# Patient Record
Sex: Female | Born: 1950 | Race: White | Hispanic: No | Marital: Married | Smoking: Never smoker
Health system: Southern US, Community
[De-identification: ages and names within clinical notes are randomized; demographics above are authoritative.]

## PROBLEM LIST (undated history)

## (undated) DIAGNOSIS — E119 Type 2 diabetes mellitus without complications: Secondary | ICD-10-CM

## (undated) DIAGNOSIS — J309 Allergic rhinitis, unspecified: Secondary | ICD-10-CM

## (undated) DIAGNOSIS — E7800 Pure hypercholesterolemia, unspecified: Secondary | ICD-10-CM

## (undated) DIAGNOSIS — I10 Essential (primary) hypertension: Secondary | ICD-10-CM

## (undated) DIAGNOSIS — M1990 Unspecified osteoarthritis, unspecified site: Secondary | ICD-10-CM

## (undated) DIAGNOSIS — F419 Anxiety disorder, unspecified: Secondary | ICD-10-CM

## (undated) HISTORY — PX: COLONOSCOPY: SHX174

## (undated) HISTORY — PX: HYSTEROSCOPY: SHX211

## (undated) HISTORY — PX: OTHER SURGICAL HISTORY: SHX169

---

## 2006-07-31 ENCOUNTER — Ambulatory Visit (HOSPITAL_COMMUNITY): Admission: RE | Admit: 2006-07-31 | Discharge: 2006-07-31 | Payer: Self-pay | Admitting: Orthopaedic Surgery

## 2013-08-30 ENCOUNTER — Encounter (HOSPITAL_COMMUNITY): Payer: Self-pay | Admitting: Pharmacy Technician

## 2013-08-30 ENCOUNTER — Other Ambulatory Visit: Payer: Self-pay | Admitting: Radiology

## 2013-09-01 ENCOUNTER — Encounter (HOSPITAL_COMMUNITY)
Admission: RE | Admit: 2013-09-01 | Discharge: 2013-09-01 | Disposition: A | Discharge status: Home / Self Care | Payer: BC Managed Care – PPO | Source: Ambulatory Visit | Attending: Orthopaedic Surgery | Admitting: Orthopaedic Surgery

## 2013-09-01 ENCOUNTER — Encounter (HOSPITAL_COMMUNITY): Payer: Self-pay

## 2013-09-01 DIAGNOSIS — Z01812 Encounter for preprocedural laboratory examination: Secondary | ICD-10-CM | POA: Insufficient documentation

## 2013-09-01 HISTORY — DX: Type 2 diabetes mellitus without complications: E11.9

## 2013-09-01 HISTORY — DX: Essential (primary) hypertension: I10

## 2013-09-01 HISTORY — DX: Unspecified osteoarthritis, unspecified site: M19.90

## 2013-09-01 HISTORY — DX: Pure hypercholesterolemia, unspecified: E78.00

## 2013-09-01 LAB — CBC WITH DIFFERENTIAL/PLATELET
Basophils Absolute: 0 10*3/uL (ref 0.0–0.1)
Basophils Relative: 0 % (ref 0–1)
Eosinophils Absolute: 0.3 10*3/uL (ref 0.0–0.7)
Eosinophils Relative: 4 % (ref 0–5)
HEMATOCRIT: 42.2 % (ref 36.0–46.0)
HEMOGLOBIN: 13.2 g/dL (ref 12.0–15.0)
LYMPHS PCT: 29 % (ref 12–46)
Lymphs Abs: 1.9 10*3/uL (ref 0.7–4.0)
MCH: 27.6 pg (ref 26.0–34.0)
MCHC: 31.3 g/dL (ref 30.0–36.0)
MCV: 88.1 fL (ref 78.0–100.0)
Monocytes Absolute: 0.7 10*3/uL (ref 0.1–1.0)
Monocytes Relative: 10 % (ref 3–12)
NEUTROS PCT: 56 % (ref 43–77)
Neutro Abs: 3.7 10*3/uL (ref 1.7–7.7)
Platelets: 334 10*3/uL (ref 150–400)
RBC: 4.79 MIL/uL (ref 3.87–5.11)
RDW: 13.7 % (ref 11.5–15.5)
WBC: 6.6 10*3/uL (ref 4.0–10.5)

## 2013-09-01 LAB — URINALYSIS, ROUTINE W REFLEX MICROSCOPIC
BILIRUBIN URINE: NEGATIVE
Glucose, UA: NEGATIVE mg/dL
HGB URINE DIPSTICK: NEGATIVE
Ketones, ur: NEGATIVE mg/dL
LEUKOCYTES UA: NEGATIVE
Nitrite: NEGATIVE
PH: 7 (ref 5.0–8.0)
Protein, ur: NEGATIVE mg/dL
Specific Gravity, Urine: 1.02 (ref 1.005–1.030)
UROBILINOGEN UA: 0.2 mg/dL (ref 0.0–1.0)

## 2013-09-01 LAB — COMPREHENSIVE METABOLIC PANEL
ALBUMIN: 3.7 g/dL (ref 3.5–5.2)
ALT: 19 U/L (ref 0–35)
AST: 21 U/L (ref 0–37)
Alkaline Phosphatase: 143 U/L — ABNORMAL HIGH (ref 39–117)
BILIRUBIN TOTAL: 0.4 mg/dL (ref 0.3–1.2)
BUN: 15 mg/dL (ref 6–23)
CALCIUM: 10.7 mg/dL — AB (ref 8.4–10.5)
CHLORIDE: 104 meq/L (ref 96–112)
CO2: 31 mEq/L (ref 19–32)
CREATININE: 1.11 mg/dL — AB (ref 0.50–1.10)
GFR calc Af Amer: 61 mL/min — ABNORMAL LOW (ref >90)
GFR, EST NON AFRICAN AMERICAN: 52 mL/min — AB (ref >90)
Glucose, Bld: 105 mg/dL — ABNORMAL HIGH (ref 70–99)
Potassium: 5.2 mEq/L (ref 3.7–5.3)
Sodium: 146 mEq/L (ref 137–147)
TOTAL PROTEIN: 7.1 g/dL (ref 6.0–8.3)

## 2013-09-01 NOTE — Patient Instructions (Signed)
Janice [name]  09/01/2013   Your procedure isTeofilo Pod scheduled on:  Tuesday, 09/07/13  [facility] [place].  Call this number if you have problems the morning of surgery: [phone]   Remember:   Do not eat food or drink liquids after midnight.   Take these medicines the morning of surgery with A SIP OF WATER: lisinopril, paxil, and tramadol if needed.   Do not wear jewelry, make-up or nail polish.  Do not wear lotions, powders, or perfumes. You may wear deodorant.  Do not shave 48 hours prior to surgery. Men may shave face and neck.  Do not bring valuables to the hospital.  [facility] is not responsible                  for any belongings or valuables.               Contacts, dentures or bridgework may not be worn into surgery.  Leave suitcase in the car. After surgery it may be brought to your room.  For patients admitted to the hospital, discharge time is determined by your                treatment team.               Patients discharged the day of surgery will not be allowed to drive  home.  Name and phone number of your driver: family  Special Instructions: Incentive Spirometry - Practice and bring it with you on the day of surgery. Shower using CHG 2 nights before surgery and the night before surgery.  If you shower the day of surgery use CHG.  Use special wash - you have one bottle of CHG for all showers.  You should use approximately 1/3 of the bottle for each shower.   Please read over the following fact sheets that you were given: Pain Booklet, Surgical Site Infection Prevention, Anesthesia Post-op Instructions and Care and Recovery After Surgery   Carpal Tunnel Release (Repair), Care After Refer to this sheet in the next few weeks. These discharge instructions provide you with general information on caring for yourself after you leave the hospital. Your caregiver may also give you specific instructions. Your treatment has been planned according to the most current  medical practices available, but unavoidable complications sometimes occur. If you have any problems or questions after discharge, please call your caregiver. HOME CARE INSTRUCTIONS   Have a responsible person with you for 24 hours.  Do not drive a car or take public transportation for 24 hours.  Only take over-the-counter or prescription medicines for pain, discomfort, or fever as directed by your caregiver. Take them as directed.  You may put ice on the palm side of the affected wrist.  Put ice in a plastic bag.  Place a towel between your skin and the bag.  Leave the ice on for 15-20 minutes, 03-04 times per day.  If you were given a splint to keep your wrist from bending, use it as directed. It is important to wear the splint at night or as directed. Use the splint for as long as you have pain or numbness in your hand, arm, or wrist. This may take 1 to 2 months.  Keep your hand raised (elevated) above the level of your heart as much as possible. This keeps swelling down and helps with discomfort.  Change bandages (dressings) as directed.  Keep the wound clean and dry. SEEK MEDICAL CARE  IF:   You develop pain not relieved with medicines.  You develop numbness of your hand.  You develop bleeding from your surgical site.  You have an oral temperature above 102 F (38.9 C).  You develop redness or swelling of the surgical site.  You develop new, unexplained problems. SEEK IMMEDIATE MEDICAL CARE IF:   You develop a rash.  You have difficulty breathing.  You develop any reaction or side effects to medicines given. MAKE SURE YOU:   Understand these instructions.  Will watch your condition.  Will get help right away if you are not doing well or get worse. Document Released: 01/25/2005 Document Revised: 04/28/2013 Document Reviewed: 05/14/2007 [facility] Patient Information 2014 [place], [place].  PATIENT INSTRUCTIONS POST-ANESTHESIA  IMMEDIATELY FOLLOWING SURGERY:   Do not drive or operate machinery for the first twenty four hours after surgery.  Do not make any important decisions for twenty four hours after surgery or while taking narcotic pain medications or sedatives.  If you develop intractable nausea and vomiting or a severe headache please notify your doctor immediately.  FOLLOW-UP:  Please make an appointment with your surgeon as instructed. You do not need to follow up with anesthesia unless specifically instructed to do so.  WOUND CARE INSTRUCTIONS (if applicable):  Keep a dry clean dressing on the anesthesia/puncture wound site if there is drainage.  Once the wound has quit draining you may leave it open to air.  Generally you should leave the bandage intact for twenty four hours unless there is drainage.  If the epidural site drains for more than 36-48 hours please call the anesthesia department.  QUESTIONS?:  Please feel free to call your physician or the hospital operator if you have any questions, and they will be happy to assist you.
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
## 2013-09-20 NOTE — H&P (Signed)
Janice [name] is an 63 y.o. female.   Chief Complaint: Carpal Tunnel Syndrome Right HPI: She has had pain in both hand, more on the right, in the median nerve distribution with decreased sensation.  She has tried rest, conservative treatment including NSAIDs. She had positive EMGs showing carpal tunnel syndrome.  She has not improved with conservative treatment.  Options of surgery has been discussed.  I have gone over risks and imponderables of surgical release as an elective outpatient procedure.  She asked appropriate questions.  She agrees to the procedure.  Past Medical History  Diagnosis Date  . Diabetes mellitus without complication   . Hypertension   . Arthritis   . Hypercholesteremia     Past Surgical History  Procedure Laterality Date  . Cesarean section      x2    No family history on file. Social History:  reports that she has never smoked. She does not have any smokeless tobacco history on file. She reports that she does not drink alcohol or use illicit drugs.  Allergies: No Known Allergies  No prescriptions prior to admission    No results found for this or any previous visit (from the past 48 hour(s)). No results found.  Review of Systems  Cardiovascular:       Hypertension.  Musculoskeletal: Positive for joint pain (Pain in the hands, decreased sensation of the median nerve distribution).    There were no vitals taken for this visit. Physical Exam  Constitutional: She is oriented to person, place, and time. She appears well-developed and well-nourished.  HENT:  Head: Normocephalic and atraumatic.  Eyes: Conjunctivae and EOM are normal. Pupils are equal, round, and reactive to light.  Neck: Normal range of motion. Neck supple.  Cardiovascular: Normal rate, regular rhythm, normal heart sounds and intact distal pulses.   Respiratory: Effort normal and breath sounds normal.  GI: Soft.  Musculoskeletal: She exhibits tenderness (Pain of right hand, decreased  sensation of the median nerve distirbution, positive Tinel and Phalen sign).  Neurological: She is alert and oriented to person, place, and time. She has normal reflexes.  Skin: Skin is warm and dry.  Psychiatric: She has a normal mood and affect. Her behavior is normal. Judgment and thought content normal.     Assessment/Plan Carpal Tunnel Syndrome Right.  For elective open carpal tunnel release as outpatient.  Abhinav Mayorquin 09/20/2013, 7:11 PM

## 2013-09-21 ENCOUNTER — Encounter (HOSPITAL_COMMUNITY): Payer: Self-pay

## 2013-09-21 ENCOUNTER — Encounter (HOSPITAL_COMMUNITY)
Admission: RE | Disposition: A | Discharge status: Home / Self Care | Payer: Self-pay | Source: Ambulatory Visit | Attending: Orthopaedic Surgery

## 2013-09-21 ENCOUNTER — Ambulatory Visit (HOSPITAL_COMMUNITY)
Admission: RE | Admit: 2013-09-21 | Discharge: 2013-09-21 | Disposition: A | Discharge status: Home / Self Care | Payer: BC Managed Care – PPO | Source: Ambulatory Visit | Attending: Orthopaedic Surgery | Admitting: Orthopaedic Surgery

## 2013-09-21 ENCOUNTER — Encounter (HOSPITAL_COMMUNITY): Payer: BC Managed Care – PPO | Admitting: Anesthesiology

## 2013-09-21 ENCOUNTER — Ambulatory Visit (HOSPITAL_COMMUNITY): Payer: BC Managed Care – PPO | Admitting: Anesthesiology

## 2013-09-21 DIAGNOSIS — G5600 Carpal tunnel syndrome, unspecified upper limb: Secondary | ICD-10-CM | POA: Insufficient documentation

## 2013-09-21 DIAGNOSIS — E119 Type 2 diabetes mellitus without complications: Secondary | ICD-10-CM | POA: Insufficient documentation

## 2013-09-21 DIAGNOSIS — Z01812 Encounter for preprocedural laboratory examination: Secondary | ICD-10-CM | POA: Insufficient documentation

## 2013-09-21 DIAGNOSIS — Z79899 Other long term (current) drug therapy: Secondary | ICD-10-CM | POA: Insufficient documentation

## 2013-09-21 DIAGNOSIS — I10 Essential (primary) hypertension: Secondary | ICD-10-CM | POA: Insufficient documentation

## 2013-09-21 HISTORY — PX: CARPAL TUNNEL RELEASE: SHX101

## 2013-09-21 LAB — GLUCOSE, CAPILLARY
Glucose-Capillary: 115 mg/dL — ABNORMAL HIGH (ref 70–99)
Glucose-Capillary: 109 mg/dL — ABNORMAL HIGH (ref 70–99)

## 2013-09-21 SURGERY — CARPAL TUNNEL RELEASE
Anesthesia: Regional | Laterality: Right

## 2013-09-21 MED ORDER — LACTATED RINGERS IV SOLN
INTRAVENOUS | Status: DC
  Administered 2013-09-21: 07:00:00 via INTRAVENOUS

## 2013-09-21 MED ORDER — GLYCOPYRROLATE 0.2 MG/ML IJ SOLN
INTRAMUSCULAR | Status: AC
  Filled 2013-09-21: qty 1

## 2013-09-21 MED ORDER — PROPOFOL INFUSION 10 MG/ML OPTIME
INTRAVENOUS | Status: DC | PRN
  Administered 2013-09-21: 100 ug/kg/min via INTRAVENOUS

## 2013-09-21 MED ORDER — PROPOFOL 10 MG/ML IV BOLUS
INTRAVENOUS | Status: AC
  Filled 2013-09-21: qty 20

## 2013-09-21 MED ORDER — SODIUM CHLORIDE 0.9 % IJ SOLN
INTRAMUSCULAR | Status: AC
  Filled 2013-09-21: qty 10

## 2013-09-21 MED ORDER — LIDOCAINE HCL (PF) 1 % IJ SOLN
INTRAMUSCULAR | Status: AC
  Filled 2013-09-21: qty 5

## 2013-09-21 MED ORDER — MIDAZOLAM HCL 2 MG/2ML IJ SOLN
INTRAMUSCULAR | Status: AC
  Filled 2013-09-21: qty 2

## 2013-09-21 MED ORDER — FENTANYL CITRATE 0.05 MG/ML IJ SOLN
INTRAMUSCULAR | Status: DC | PRN
  Administered 2013-09-21 (×3): 25 ug via INTRAVENOUS

## 2013-09-21 MED ORDER — LIDOCAINE HCL (PF) 0.5 % IJ SOLN
INTRAMUSCULAR | Status: AC
  Filled 2013-09-21: qty 50

## 2013-09-21 MED ORDER — MIDAZOLAM HCL 2 MG/2ML IJ SOLN
1.0000 mg | INTRAMUSCULAR | Status: DC | PRN
  Administered 2013-09-21: 2 mg via INTRAVENOUS

## 2013-09-21 MED ORDER — GLYCOPYRROLATE 0.2 MG/ML IJ SOLN
0.2000 mg | Freq: Once | INTRAMUSCULAR | Status: AC
  Administered 2013-09-21: 0.2 mg via INTRAVENOUS
  Filled 2013-09-21: qty 1

## 2013-09-21 MED ORDER — ONDANSETRON HCL 4 MG/2ML IJ SOLN
INTRAMUSCULAR | Status: AC
  Filled 2013-09-21: qty 2

## 2013-09-21 MED ORDER — LIDOCAINE HCL (PF) 0.5 % IJ SOLN
INTRAMUSCULAR | Status: DC | PRN
  Administered 2013-09-21: 50 mL via INTRAVENOUS

## 2013-09-21 MED ORDER — FENTANYL CITRATE 0.05 MG/ML IJ SOLN: 25.0000 ug | INTRAMUSCULAR | Status: DC | PRN

## 2013-09-21 MED ORDER — LIDOCAINE HCL (CARDIAC) 10 MG/ML IV SOLN
INTRAVENOUS | Status: DC | PRN
  Administered 2013-09-21: 10 mg via INTRAVENOUS

## 2013-09-21 MED ORDER — FENTANYL CITRATE 0.05 MG/ML IJ SOLN
INTRAMUSCULAR | Status: AC
  Filled 2013-09-21: qty 2

## 2013-09-21 MED ORDER — SODIUM CHLORIDE 0.9 % IR SOLN
Status: DC | PRN
  Administered 2013-09-21: 1000 mL

## 2013-09-21 MED ORDER — ONDANSETRON HCL 4 MG/2ML IJ SOLN: 4.0000 mg | Freq: Once | INTRAMUSCULAR | Status: DC | PRN

## 2013-09-21 MED ORDER — SODIUM CHLORIDE 0.9 % IJ SOLN
INTRAMUSCULAR | Status: DC | PRN
  Administered 2013-09-21: 2 mL via INTRAVENOUS

## 2013-09-21 MED ORDER — ONDANSETRON HCL 4 MG/2ML IJ SOLN
4.0000 mg | Freq: Once | INTRAMUSCULAR | Status: AC
  Administered 2013-09-21: 4 mg via INTRAVENOUS

## 2013-09-21 MED ORDER — FENTANYL CITRATE 0.05 MG/ML IJ SOLN
25.0000 ug | INTRAMUSCULAR | Status: DC
  Administered 2013-09-21: 25 ug via INTRAVENOUS

## 2013-09-21 MED ORDER — CHLORHEXIDINE GLUCONATE 4 % EX LIQD: 60.0000 mL | Freq: Once | CUTANEOUS | Status: DC

## 2013-09-21 SURGICAL SUPPLY — 45 items
BAG HAMPER (MISCELLANEOUS) ×3 IMPLANT
BANDAGE CONFORM 3  STR LF (GAUZE/BANDAGES/DRESSINGS) ×2 IMPLANT
BANDAGE ELASTIC 3 VELCRO NS (GAUZE/BANDAGES/DRESSINGS) ×3 IMPLANT
BANDAGE ESMARK 4X12 BL STRL LF (DISPOSABLE) ×1 IMPLANT
BLADE SURG 15 STRL LF DISP TIS (BLADE) ×1 IMPLANT
BLADE SURG 15 STRL SS (BLADE) ×3
BNDG CMPR 12X4 ELC STRL LF (DISPOSABLE) ×1
BNDG ESMARK 4X12 BLUE STRL LF (DISPOSABLE) ×3
CLOTH BEACON ORANGE TIMEOUT ST (SAFETY) ×3 IMPLANT
COVER LIGHT HANDLE STERIS (MISCELLANEOUS) ×6 IMPLANT
CUFF TOURNIQUET SINGLE 18IN (TOURNIQUET CUFF) ×1 IMPLANT
CUFF TOURNIQUET SINGLE 24IN (TOURNIQUET CUFF) ×2 IMPLANT
DRSG XEROFORM 1X8 (GAUZE/BANDAGES/DRESSINGS) ×2 IMPLANT
DURAPREP 26ML APPLICATOR (WOUND CARE) ×3 IMPLANT
ELECT NDL TIP 2.8 STRL (NEEDLE) IMPLANT
ELECT NEEDLE TIP 2.8 STRL (NEEDLE) IMPLANT
ELECT REM PT RETURN 9FT ADLT (ELECTROSURGICAL) ×3
ELECTRODE REM PT RTRN 9FT ADLT (ELECTROSURGICAL) ×1 IMPLANT
FORMALIN 10 PREFIL 120ML (MISCELLANEOUS) ×3 IMPLANT
GLOVE BIO SURGEON STRL SZ8 (GLOVE) ×3 IMPLANT
GLOVE BIO SURGEON STRL SZ8.5 (GLOVE) ×3 IMPLANT
GLOVE BIOGEL PI IND STRL 7.0 (GLOVE) IMPLANT
GLOVE BIOGEL PI IND STRL 7.5 (GLOVE) IMPLANT
GLOVE BIOGEL PI INDICATOR 7.0 (GLOVE) ×2
GLOVE BIOGEL PI INDICATOR 7.5 (GLOVE) ×2
GLOVE SS BIOGEL STRL SZ 6.5 (GLOVE) IMPLANT
GLOVE SUPERSENSE BIOGEL SZ 6.5 (GLOVE) ×2
GOWN STRL REUS W/TWL LRG LVL3 (GOWN DISPOSABLE) ×6 IMPLANT
GOWN STRL REUS W/TWL XL LVL3 (GOWN DISPOSABLE) ×3 IMPLANT
KIT ROOM TURNOVER APOR (KITS) ×3 IMPLANT
NDL HYPO 18GX1.5 BLUNT FILL (NEEDLE) ×1 IMPLANT
NDL HYPO 27GX1-1/4 (NEEDLE) ×1 IMPLANT
NEEDLE HYPO 18GX1.5 BLUNT FILL (NEEDLE) ×3 IMPLANT
NEEDLE HYPO 27GX1-1/4 (NEEDLE) ×3 IMPLANT
NS IRRIG 1000ML POUR BTL (IV SOLUTION) ×3 IMPLANT
PACK BASIC LIMB (CUSTOM PROCEDURE TRAY) ×3 IMPLANT
PAD ARMBOARD 7.5X6 YLW CONV (MISCELLANEOUS) ×3 IMPLANT
PAD CAST 3X4 CTTN HI CHSV (CAST SUPPLIES) ×1 IMPLANT
PADDING CAST COTTON 3X4 STRL (CAST SUPPLIES) ×3
SET BASIN LINEN APH (SET/KITS/TRAYS/PACK) ×3 IMPLANT
SPONGE GAUZE 4X4 12PLY (GAUZE/BANDAGES/DRESSINGS) ×3 IMPLANT
SUT ETHILON 3 0 FSL (SUTURE) ×3 IMPLANT
SYR 3ML LL SCALE MARK (SYRINGE) ×3 IMPLANT
TOWEL OR 17X26 4PK STRL BLUE (TOWEL DISPOSABLE) ×3 IMPLANT
VESSEL LOOPS MAXI RED (MISCELLANEOUS) ×3 IMPLANT

## 2013-09-21 NOTE — Progress Notes (Signed)
The History and Physical is unchanged. I have examined the patient. The patient is medically able to have surgery on the right hand/wrist . Janice [name] 

## 2013-09-21 NOTE — Transfer of Care (Signed)
Immediate Anesthesia Transfer of Care Note  Patient: Janice [name]  Procedure(s) Performed: Procedure(s) (LRB): CARPAL TUNNEL RELEASE (Right)  Patient Location: PACU  Anesthesia Type: MAC  Level of Consciousness: awake  Airway & Oxygen Therapy: Patient Spontanous Breathing. Nasal cannula  Post-op Assessment: Report given to PACU RN, Post -op Vital signs reviewed and stable and Patient moving all extremities  Post vital signs: Reviewed and stable  Complications: No apparent anesthesia complications

## 2013-09-21 NOTE — Anesthesia Procedure Notes (Signed)
Procedure Name: MAC Date/Time: 09/21/2013 7:23 AM Performed by: Franco NonesYATES, Andriea Hasegawa S Pre-anesthesia Checklist: Patient identified, Emergency Drugs available, Suction available, Timeout performed and Patient being monitored Patient Re-evaluated:Patient Re-evaluated prior to inductionOxygen Delivery Method: Nasal Cannula    Anesthesia Regional Block:  Bier block (IV Regional)  Pre-Anesthetic Checklist: ,, timeout performed, Correct Patient, Correct Site, Correct Laterality, Correct Procedure,, site marked, surgical consent,, at surgeon's request  Laterality: Right     Needles:  Injection technique: Single-shot  Needle Type: Other      Needle Gauge: 22 and 22 G    Additional Needles: Bier block (IV Regional)  Nerve Stimulator or Paresthesia:   Additional Responses:  Pulse checked post tourniquet inflation. IV NSL discontinued post injection. Narrative:  Start time: 09/21/2013 7:33 AM End time: 09/21/2013 7:35 AM  Performed by: Personally

## 2013-09-21 NOTE — Brief Op Note (Signed)
09/21/2013  8:07 AM  PATIENT:  Janice [name]  63 y.o. female  PRE-OPERATIVE DIAGNOSIS:  carpal tunnel syndrome right wrist  POST-OPERATIVE DIAGNOSIS:  carpal tunnel syndrome right wrist  PROCEDURE:  Procedure(s): CARPAL TUNNEL RELEASE (Right)  SURGEON:  Surgeon(s) and Role: Jalexus Brett     * Darreld McleanWayne Jadarious Dobbins, MD - Primary  PHYSICIAN ASSISTANT:   ASSISTANTS: none   ANESTHESIA:   regional  EBL:  Total I/O In: 750 [I.V.:750] Out: 0   BLOOD ADMINISTERED:none  DRAINS: none   LOCAL MEDICATIONS USED:  NONE  SPECIMEN:  Source of Specimen:  right volar carpal ligament  DISPOSITION OF SPECIMEN:  PATHOLOGY  COUNTS:  YES  TOURNIQUET:   Total Tourniquet Time Documented: Upper Arm (Right) - 0 minutes Upper Arm (Right) - 31 minutes Total: Upper Arm (Right) - 31 minutes   DICTATION: .Other Dictation: Dictation Number [id number]  PLAN OF CARE: Discharge to home after PACU  PATIENT DISPOSITION:  PACU - hemodynamically stable.   Delay start of Pharmacological VTE agent (>24hrs) due to surgical blood loss or risk of bleeding: not applicable

## 2013-09-21 NOTE — Anesthesia Preprocedure Evaluation (Addendum)
Anesthesia Evaluation  Patient identified by MRN, date of birth, ID band Patient awake    Reviewed: Allergy & Precautions, H&P , NPO status , Patient's Chart, lab work & pertinent test results  Airway Mallampati: II TM Distance: >3 FB Neck ROM: Full    Dental  (+) Teeth Intact   Pulmonary neg pulmonary ROS,  breath sounds clear to auscultation        Cardiovascular hypertension, Pt. on medications Rhythm:Regular Rate:Normal     Neuro/Psych    GI/Hepatic   Endo/Other  diabetes, Well Controlled, Type 2, Oral Hypoglycemic Agents  Renal/GU      Musculoskeletal   Abdominal   Peds  Hematology   Anesthesia Other Findings   Reproductive/Obstetrics                           Anesthesia Physical Anesthesia Plan  ASA: III  Anesthesia Plan: Bier Block   Post-op Pain Management:    Induction: Intravenous  Airway Management Planned: Nasal Cannula  Additional Equipment:   Intra-op Plan:   Post-operative Plan:   Informed Consent: I have reviewed the patients History and Physical, chart, labs and discussed the procedure including the risks, benefits and alternatives for the proposed anesthesia with the patient or authorized representative who has indicated his/her understanding and acceptance.     Plan Discussed with:   Anesthesia Plan Comments:         Anesthesia Quick Evaluation  

## 2013-09-21 NOTE — Anesthesia Postprocedure Evaluation (Signed)
Anesthesia Post Note  Patient: Janice [name]  Procedure(s) Performed: Procedure(s) (LRB): CARPAL TUNNEL RELEASE (Right)  Anesthesia type: MAC  Patient location: PACU  Post pain: Pain level controlled  Post assessment: Post-op Vital signs reviewed, Patient's Cardiovascular Status Stable, Respiratory Function Stable, Patent Airway, No signs of Nausea or vomiting and Pain level controlled  Last Vitals:  Filed Vitals:   09/21/13 0811  BP: 125/61  Pulse: 91  Temp: 36.5 C  Resp: 15    Post vital signs: Reviewed and stable  Level of consciousness: awake and alert   Complications: No apparent anesthesia complications
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
## 2013-09-23 ENCOUNTER — Encounter (HOSPITAL_COMMUNITY): Payer: Self-pay | Admitting: Orthopaedic Surgery

## 2013-10-20 ENCOUNTER — Other Ambulatory Visit: Payer: Self-pay | Admitting: Radiology

## 2013-10-21 ENCOUNTER — Other Ambulatory Visit: Payer: Self-pay | Admitting: Radiology

## 2013-10-21 ENCOUNTER — Encounter (HOSPITAL_COMMUNITY): Payer: Self-pay | Admitting: Pharmacy Technician

## 2013-10-27 ENCOUNTER — Encounter (HOSPITAL_COMMUNITY): Payer: Self-pay

## 2013-10-27 ENCOUNTER — Encounter (HOSPITAL_COMMUNITY)
Admission: RE | Admit: 2013-10-27 | Discharge: 2013-10-27 | Disposition: A | Discharge status: Home / Self Care | Payer: BC Managed Care – PPO | Source: Ambulatory Visit | Attending: Orthopaedic Surgery | Admitting: Orthopaedic Surgery

## 2013-10-27 NOTE — Patient Instructions (Signed)
Janice [name]  10/27/2013   Your procedure is scheduled on:  Tuesday, 11/02/13  Report to [facility] [place] [facility].  Call this number if you have problems the morning of surgery: [phone]   Remember:   Do not eat food or drink liquids after midnight.   Take these medicines the morning of surgery with A SIP OF WATER: lisinopril and paxil   Do not wear jewelry, make-up or nail polish.  Do not wear lotions, powders, or perfumes. You may wear deodorant.  Do not shave 48 hours prior to surgery. Men may shave face and neck.  Do not bring valuables to the hospital.  [facility] is not responsible                  for any belongings or valuables.               Contacts, dentures or bridgework may not be worn into surgery.  Leave suitcase in the car. After surgery it may be brought to your room.  For patients admitted to the hospital, discharge time is determined by your                treatment team.               Patients discharged the day of surgery will not be allowed to drive  home.  Name and phone number of your driver: family  Special Instructions: Shower using CHG 2 nights before surgery and the night before surgery.  If you shower the day of surgery use CHG.  Use special wash - you have one bottle of CHG for all showers.  You should use approximately 1/3 of the bottle for each shower.   Please read over the following fact sheets that you were given: Pain Booklet, MRSA Information, Surgical Site Infection Prevention, Anesthesia Post-op Instructions and Care and Recovery After Surgery Carpal Tunnel Release (Repair), Care After Refer to this sheet in the next few weeks. These discharge instructions provide you with general information on caring for yourself after you leave the hospital. Your caregiver may also give you specific instructions. Your treatment has been planned according to the most current medical practices available, but unavoidable complications sometimes occur. If you have  any problems or questions after discharge, please call your caregiver. HOME CARE INSTRUCTIONS   Have a responsible person with you for 24 hours.  Do not drive a car or take public transportation for 24 hours.  Only take over-the-counter or prescription medicines for pain, discomfort, or fever as directed by your caregiver. Take them as directed.  You may put ice on the palm side of the affected wrist.  Put ice in a plastic bag.  Place a towel between your skin and the bag.  Leave the ice on for 15-20 minutes, 03-04 times per day.  If you were given a splint to keep your wrist from bending, use it as directed. It is important to wear the splint at night or as directed. Use the splint for as long as you have pain or numbness in your hand, arm, or wrist. This may take 1 to 2 months.  Keep your hand raised (elevated) above the level of your heart as much as possible. This keeps swelling down and helps with discomfort.  Change bandages (dressings) as directed.  Keep the wound clean and dry. SEEK MEDICAL CARE IF:   You develop pain not relieved with medicines.  You develop numbness of your hand.  You develop bleeding from your surgical site.  You have an oral temperature above 102 F (38.9 C).  You develop redness or swelling of the surgical site.  You develop new, unexplained problems. SEEK IMMEDIATE MEDICAL CARE IF:   You develop a rash.  You have difficulty breathing.  You develop any reaction or side effects to medicines given. MAKE SURE YOU:   Understand these instructions.  Will watch your condition.  Will get help right away if you are not doing well or get worse. Document Released: 01/25/2005 Document Revised: 04/28/2013 Document Reviewed: 05/14/2007 [facility] Patient Information 2014 [place], [place]. Carpal Tunnel Release Carpal tunnel release is done to relieve the pressure on the nerves and tendons on the bottom side of your wrist.  LET YOUR CAREGIVER KNOW  ABOUT:   Allergies to food or medicine.  Medicines taken, including vitamins, herbs, eyedrops, over-the-counter medicines, and creams.  Use of steroids (by mouth or creams).  Previous problems with anesthetics or numbing medicines.  History of bleeding problems or blood clots.  Previous surgery.  Other health problems, including diabetes and kidney problems.  Possibility of pregnancy, if this applies. RISKS AND COMPLICATIONS  Some problems that may happen after this procedure include:  Infection.  Damage to the nerves, arteries or tendons could occur. This would be very uncommon.  Bleeding. BEFORE THE PROCEDURE   This surgery may be done while you are asleep (general anesthetic) or may be done under a block where only your forearm and the surgical area is numb.  If the surgery is done under a block, the numbness will gradually wear off within several hours after surgery. HOME CARE INSTRUCTIONS   Have a responsible person with you for 24 hours.  Do not drive a car or use public transportation for 24 hours.  Only take over-the-counter or prescription medicines for pain, discomfort, or fever as directed by your caregiver. Take them as directed.  You may put ice on the palm side of the affected wrist.  Put ice in a plastic bag.  Place a towel between your skin and the bag.  Leave the ice on for 20 to 30 minutes, 4 times per day.  If you were given a splint to keep your wrist from bending, use it as directed. It is important to wear the splint at night or as directed. Use the splint for as long as you have pain or numbness in your hand, arm, or wrist. This may take 1 to 2 months.  Keep your hand raised (elevated) above the level of your heart as much as possible. This keeps swelling down and helps with discomfort.  Change bandages (dressings) as directed.  Keep the wound clean and dry. SEEK MEDICAL CARE IF:   You develop pain not relieved with medications.  You  develop numbness of your hand.  You develop bleeding from your surgical site.  You have an oral temperature above 102 F (38.9 C).  You develop redness or swelling of the surgical site.  You develop new, unexplained problems. SEEK IMMEDIATE MEDICAL CARE IF:   You develop a rash.  You have difficulty breathing.  You develop any reaction or side effects to medications given. Document Released: 09/28/2003 Document Revised: 09/30/2011 Document Reviewed: 05/14/2007 [facility] Patient Information 2014 [place], [place].

## 2013-11-01 NOTE — H&P (Signed)
Janice [name] is an 63 y.o. female.   Chief Complaint: Carpal tunnel syndrome left HPI: She has history of bilateral carpal tunnel syndrome.  She has had surgery on the right and has done well.  She has pain in the left hand at night in the median nerve distribution unrelieved by conservative treatment.  She would like to have surgery on the left hand now.  She understands the risks and imponderables for this outpatient surgery.  Past Medical History  Diagnosis Date  . Diabetes mellitus without complication   . Hypertension   . Arthritis   . Hypercholesteremia     Past Surgical History  Procedure Laterality Date  . Cesarean section      x2  . Carpal tunnel release Right 09/21/2013    Procedure: CARPAL TUNNEL RELEASE;  Surgeon: Darreld McleanWayne Zeki Bedrosian, MD;  Location: [facility];  Service: Orthopedics;  Laterality: Right;    No family history on file. Social History:  reports that she has never smoked. She does not have any smokeless tobacco history on file. She reports that she does not drink alcohol or use illicit drugs.  Allergies: No Known Allergies  No prescriptions prior to admission    No results found for this or any previous visit (from the past 48 hour(s)). No results found.  Review of Systems  Cardiovascular:       History of hypertension.  Musculoskeletal:       Status post carpal tunnel release on the right, now with carpal tunnel on the left.  Endo/Heme/Allergies:       History of elevated blood sugar on oral medicine.    There were no vitals taken for this visit. Physical Exam  Constitutional: She is oriented to person, place, and time. She appears well-developed and well-nourished.  HENT:  Head: Normocephalic and atraumatic.  Eyes: EOM are normal. Pupils are equal, round, and reactive to light.  Neck: Normal range of motion.  Cardiovascular: Normal rate, regular rhythm, normal heart sounds and intact distal pulses.   Respiratory: Effort normal and breath sounds normal.   GI: Soft.  Musculoskeletal: She exhibits tenderness (pain left wrist with positive Tinel and Phalens.  Well healed scar volar right wrist and normal NV status right.).  Neurological: She is alert and oriented to person, place, and time. She has normal reflexes.  Skin: Skin is warm and dry.  Psychiatric: She has a normal mood and affect. Her behavior is normal. Thought content normal.     Assessment/Plan Carpal tunnel left, for open release.  Post right carpal tunnel surgery doing well. Hypertension.   Darreld McleanWayne Allean Montfort 11/01/2013, 4:38 PM

## 2013-11-02 ENCOUNTER — Encounter (HOSPITAL_COMMUNITY): Payer: BC Managed Care – PPO | Admitting: Anesthesiology

## 2013-11-02 ENCOUNTER — Ambulatory Visit (HOSPITAL_COMMUNITY)
Admission: RE | Admit: 2013-11-02 | Discharge: 2013-11-02 | Disposition: A | Discharge status: Home / Self Care | Payer: BC Managed Care – PPO | Source: Ambulatory Visit | Attending: Orthopaedic Surgery | Admitting: Orthopaedic Surgery

## 2013-11-02 ENCOUNTER — Encounter (HOSPITAL_COMMUNITY)
Admission: RE | Disposition: A | Discharge status: Home / Self Care | Payer: Self-pay | Source: Ambulatory Visit | Attending: Orthopaedic Surgery

## 2013-11-02 ENCOUNTER — Encounter (HOSPITAL_COMMUNITY): Payer: Self-pay | Admitting: *Deleted

## 2013-11-02 ENCOUNTER — Ambulatory Visit (HOSPITAL_COMMUNITY): Payer: BC Managed Care – PPO | Admitting: Anesthesiology

## 2013-11-02 DIAGNOSIS — E119 Type 2 diabetes mellitus without complications: Secondary | ICD-10-CM | POA: Insufficient documentation

## 2013-11-02 DIAGNOSIS — Z7982 Long term (current) use of aspirin: Secondary | ICD-10-CM | POA: Insufficient documentation

## 2013-11-02 DIAGNOSIS — G5600 Carpal tunnel syndrome, unspecified upper limb: Secondary | ICD-10-CM | POA: Diagnosis present

## 2013-11-02 DIAGNOSIS — E7800 Pure hypercholesterolemia, unspecified: Secondary | ICD-10-CM | POA: Diagnosis not present

## 2013-11-02 DIAGNOSIS — I10 Essential (primary) hypertension: Secondary | ICD-10-CM | POA: Insufficient documentation

## 2013-11-02 DIAGNOSIS — Z79899 Other long term (current) drug therapy: Secondary | ICD-10-CM | POA: Diagnosis not present

## 2013-11-02 HISTORY — PX: CARPAL TUNNEL RELEASE: SHX101

## 2013-11-02 LAB — GLUCOSE, CAPILLARY: Glucose-Capillary: 101 mg/dL — ABNORMAL HIGH (ref 70–99)

## 2013-11-02 SURGERY — CARPAL TUNNEL RELEASE
Anesthesia: Regional | Site: Wrist | Laterality: Left

## 2013-11-02 MED ORDER — FENTANYL CITRATE 0.05 MG/ML IJ SOLN
INTRAMUSCULAR | Status: AC
  Filled 2013-11-02: qty 2

## 2013-11-02 MED ORDER — FENTANYL CITRATE 0.05 MG/ML IJ SOLN
INTRAMUSCULAR | Status: DC | PRN
  Administered 2013-11-02: 25 ug via INTRAVENOUS
  Administered 2013-11-02: 50 ug via INTRAVENOUS
  Administered 2013-11-02: 25 ug via INTRAVENOUS

## 2013-11-02 MED ORDER — CHLORHEXIDINE GLUCONATE 4 % EX LIQD: 60.0000 mL | Freq: Once | CUTANEOUS | Status: DC

## 2013-11-02 MED ORDER — MIDAZOLAM HCL 2 MG/2ML IJ SOLN
INTRAMUSCULAR | Status: AC
  Filled 2013-11-02: qty 2

## 2013-11-02 MED ORDER — LIDOCAINE HCL (PF) 0.5 % IJ SOLN
INTRAMUSCULAR | Status: DC | PRN
  Administered 2013-11-02: 40 mL via INTRAVENOUS

## 2013-11-02 MED ORDER — PROPOFOL INFUSION 10 MG/ML OPTIME
INTRAVENOUS | Status: DC | PRN
  Administered 2013-11-02: 50 ug/kg/min via INTRAVENOUS

## 2013-11-02 MED ORDER — SODIUM CHLORIDE 0.9 % IJ SOLN
INTRAMUSCULAR | Status: AC
  Filled 2013-11-02: qty 10

## 2013-11-02 MED ORDER — FENTANYL CITRATE 0.05 MG/ML IJ SOLN: 25.0000 ug | INTRAMUSCULAR | Status: DC | PRN

## 2013-11-02 MED ORDER — LACTATED RINGERS IV SOLN
INTRAVENOUS | Status: DC | PRN
Start: 2013-11-02 — End: 2013-11-02
  Administered 2013-11-02: 08:00:00 via INTRAVENOUS

## 2013-11-02 MED ORDER — LIDOCAINE HCL (CARDIAC) 20 MG/ML IV SOLN
INTRAVENOUS | Status: DC | PRN
Start: 2013-11-02 — End: 2013-11-02
  Administered 2013-11-02: 10 mg via INTRAVENOUS

## 2013-11-02 MED ORDER — LACTATED RINGERS IV SOLN
INTRAVENOUS | Status: DC
  Administered 2013-11-02: 1000 mL via INTRAVENOUS

## 2013-11-02 MED ORDER — LIDOCAINE HCL (PF) 0.5 % IJ SOLN
INTRAMUSCULAR | Status: AC
  Filled 2013-11-02: qty 50

## 2013-11-02 MED ORDER — FENTANYL CITRATE 0.05 MG/ML IJ SOLN
25.0000 ug | INTRAMUSCULAR | Status: AC
  Administered 2013-11-02: 25 ug via INTRAVENOUS

## 2013-11-02 MED ORDER — MIDAZOLAM HCL 2 MG/2ML IJ SOLN
1.0000 mg | INTRAMUSCULAR | Status: DC | PRN
  Administered 2013-11-02: 2 mg via INTRAVENOUS

## 2013-11-02 MED ORDER — SODIUM CHLORIDE 0.9 % IJ SOLN
INTRAMUSCULAR | Status: DC | PRN
  Administered 2013-11-02: 1 mL via INTRAVENOUS

## 2013-11-02 MED ORDER — ONDANSETRON HCL 4 MG/2ML IJ SOLN: 4.0000 mg | Freq: Once | INTRAMUSCULAR | Status: DC | PRN

## 2013-11-02 MED ORDER — PROPOFOL 10 MG/ML IV EMUL
INTRAVENOUS | Status: AC
  Filled 2013-11-02: qty 20

## 2013-11-02 MED ORDER — SODIUM CHLORIDE 0.9 % IR SOLN
Status: DC | PRN
  Administered 2013-11-02: 1000 mL

## 2013-11-02 SURGICAL SUPPLY — 43 items
BAG HAMPER (MISCELLANEOUS) ×3 IMPLANT
BANDAGE ELASTIC 3 VELCRO NS (GAUZE/BANDAGES/DRESSINGS) ×3 IMPLANT
BANDAGE ESMARK 4X12 BL STRL LF (DISPOSABLE) ×1 IMPLANT
BLADE SURG 15 STRL LF DISP TIS (BLADE) ×1 IMPLANT
BLADE SURG 15 STRL SS (BLADE) ×3
BNDG CMPR 12X4 ELC STRL LF (DISPOSABLE) ×1
BNDG ESMARK 4X12 BLUE STRL LF (DISPOSABLE) ×3
CLOTH BEACON ORANGE TIMEOUT ST (SAFETY) ×3 IMPLANT
COVER LIGHT HANDLE STERIS (MISCELLANEOUS) ×6 IMPLANT
CUFF TOURNIQUET SINGLE 18IN (TOURNIQUET CUFF) ×3 IMPLANT
DRSG XEROFORM 1X8 (GAUZE/BANDAGES/DRESSINGS) ×2 IMPLANT
DURAPREP 26ML APPLICATOR (WOUND CARE) ×3 IMPLANT
ELECT NDL TIP 2.8 STRL (NEEDLE) IMPLANT
ELECT NEEDLE TIP 2.8 STRL (NEEDLE) IMPLANT
ELECT REM PT RETURN 9FT ADLT (ELECTROSURGICAL) ×3
ELECTRODE REM PT RTRN 9FT ADLT (ELECTROSURGICAL) ×1 IMPLANT
FORMALIN 10 PREFIL 120ML (MISCELLANEOUS) ×3 IMPLANT
GLOVE BIO SURGEON STRL SZ8 (GLOVE) ×3 IMPLANT
GLOVE BIO SURGEON STRL SZ8.5 (GLOVE) ×3 IMPLANT
GLOVE BIOGEL PI IND STRL 7.0 (GLOVE) IMPLANT
GLOVE BIOGEL PI IND STRL 7.5 (GLOVE) IMPLANT
GLOVE BIOGEL PI INDICATOR 7.0 (GLOVE) ×2
GLOVE BIOGEL PI INDICATOR 7.5 (GLOVE) ×2
GLOVE ECLIPSE 7.0 STRL STRAW (GLOVE) ×2 IMPLANT
GOWN STRL REUS W/TWL LRG LVL3 (GOWN DISPOSABLE) ×6 IMPLANT
GOWN STRL REUS W/TWL XL LVL3 (GOWN DISPOSABLE) ×3 IMPLANT
KIT ROOM TURNOVER APOR (KITS) ×3 IMPLANT
NDL HYPO 18GX1.5 BLUNT FILL (NEEDLE) ×1 IMPLANT
NDL HYPO 27GX1-1/4 (NEEDLE) ×1 IMPLANT
NEEDLE HYPO 18GX1.5 BLUNT FILL (NEEDLE) ×3 IMPLANT
NEEDLE HYPO 27GX1-1/4 (NEEDLE) ×3 IMPLANT
NS IRRIG 1000ML POUR BTL (IV SOLUTION) ×3 IMPLANT
PACK BASIC LIMB (CUSTOM PROCEDURE TRAY) ×3 IMPLANT
PAD ARMBOARD 7.5X6 YLW CONV (MISCELLANEOUS) ×3 IMPLANT
PAD CAST 3X4 CTTN HI CHSV (CAST SUPPLIES) ×1 IMPLANT
PADDING CAST COTTON 3X4 STRL (CAST SUPPLIES) ×3
PADDING WEBRIL 4 STERILE (GAUZE/BANDAGES/DRESSINGS) ×2 IMPLANT
SET BASIN LINEN APH (SET/KITS/TRAYS/PACK) ×3 IMPLANT
SPONGE GAUZE 4X4 12PLY (GAUZE/BANDAGES/DRESSINGS) ×3 IMPLANT
SUT ETHILON 3 0 FSL (SUTURE) ×3 IMPLANT
SYR 3ML LL SCALE MARK (SYRINGE) ×3 IMPLANT
TOWEL OR 17X26 4PK STRL BLUE (TOWEL DISPOSABLE) ×3 IMPLANT
VESSEL LOOPS MAXI RED (MISCELLANEOUS) ×3 IMPLANT

## 2013-11-02 NOTE — Discharge Instructions (Signed)
Elevate left hand.  Move fingers often.  Do not remove dressing or get it wet.  See Dr. Hilda LiasKeeling in one week.  If any problem, Call Dr. Sanjuan DameKeeling's office at [phone] or if after hours, hospital at [phone].

## 2013-11-02 NOTE — Progress Notes (Signed)
The History and Physical is unchanged. I have examined the patient. The patient is medically able to have surgery on the left wrist . [name] 

## 2013-11-02 NOTE — Anesthesia Preprocedure Evaluation (Addendum)
Anesthesia Evaluation  Patient identified by MRN, date of birth, ID band Patient awake    Reviewed: Allergy & Precautions, H&P , NPO status , Patient's Chart, lab work & pertinent test results  Airway Mallampati: II TM Distance: >3 FB Neck ROM: Full    Dental  (+) Teeth Intact   Pulmonary neg pulmonary ROS,  breath sounds clear to auscultation        Cardiovascular hypertension, Pt. on medications Rhythm:Regular Rate:Normal     Neuro/Psych    GI/Hepatic   Endo/Other  diabetes, Well Controlled, Type 2, Oral Hypoglycemic Agents  Renal/GU      Musculoskeletal   Abdominal   Peds  Hematology   Anesthesia Other Findings   Reproductive/Obstetrics                           Anesthesia Physical Anesthesia Plan  ASA: III  Anesthesia Plan: Bier Block   Post-op Pain Management:    Induction: Intravenous  Airway Management Planned: Nasal Cannula  Additional Equipment:   Intra-op Plan:   Post-operative Plan:   Informed Consent: I have reviewed the patients History and Physical, chart, labs and discussed the procedure including the risks, benefits and alternatives for the proposed anesthesia with the patient or authorized representative who has indicated his/her understanding and acceptance.     Plan Discussed with:   Anesthesia Plan Comments:         Anesthesia Quick Evaluation

## 2013-11-02 NOTE — Transfer of Care (Signed)
Immediate Anesthesia Transfer of Care Note  Patient: Janice [name]  Procedure(s) Performed: Procedure(s): OPEN LEFT CARPAL TUNNEL RELEASE (Left)  Patient Location: PACU  Anesthesia Type:MAC and Bier block  Level of Consciousness: awake, alert , oriented and patient cooperative  Airway & Oxygen Therapy: Patient Spontanous Breathing and Patient connected to nasal cannula oxygen  Post-op Assessment: Report given to PACU RN, Post -op Vital signs reviewed and stable and Patient moving all extremities  Post vital signs: Reviewed and stable  Complications: No apparent anesthesia complications

## 2013-11-02 NOTE — Anesthesia Postprocedure Evaluation (Signed)
  Anesthesia Post-op Note  Patient: Janice [name]  Procedure(s) Performed: Procedure(s): OPEN LEFT CARPAL TUNNEL RELEASE (Left)  Patient Location: PACU  Anesthesia Type:MAC and Bier block  Level of Consciousness: awake, alert , oriented and patient cooperative  Airway and Oxygen Therapy: Patient Spontanous Breathing  Post-op Pain: none  Post-op Assessment: Post-op Vital signs reviewed, Patient's Cardiovascular Status Stable, Respiratory Function Stable, Patent Airway, No signs of Nausea or vomiting, Adequate PO intake, Pain level controlled, No headache and No backache  Post-op Vital Signs: Reviewed and stable  Last Vitals:  Filed Vitals:   11/02/13 0820  BP: 128/44  Temp:   Resp:     Complications: No apparent anesthesia complications

## 2013-11-02 NOTE — Brief Op Note (Signed)
11/02/2013  9:05 AM  PATIENT:  Janice [name]  63 y.o. female  PRE-OPERATIVE DIAGNOSIS:  carpal tunnel syndrome left wrist  POST-OPERATIVE DIAGNOSIS:  carpal tunnel syndrome left wrist  PROCEDURE:  Procedure(s): OPEN LEFT CARPAL TUNNEL RELEASE (Left)  SURGEON:  Surgeon(s) and Role:    * Darreld McleanWayne Arnetia Bronk, MD - Primary  PHYSICIAN ASSISTANT:   ASSISTANTS: none   ANESTHESIA:   regional  EBL:     BLOOD ADMINISTERED:none  DRAINS: none   LOCAL MEDICATIONS USED:  NONE  SPECIMEN:  Source of Specimen:  Left volar carpal ligament  DISPOSITION OF SPECIMEN:  PATHOLOGY  COUNTS:  YES  TOURNIQUET:  * Missing tourniquet times found for documented tourniquets in log:  [id number] *  DICTATION: .Other Dictation: Dictation Number [id number]  PLAN OF CARE: Discharge to home after PACU  PATIENT DISPOSITION:  PACU - hemodynamically stable.   Delay start of Pharmacological VTE agent (>24hrs) due to surgical blood loss or risk of bleeding: not applicable

## 2013-11-03 NOTE — Op Note (Signed)
[name]:  [name], Janice [name]               ACCOUNT NO.:  [id number]  MEDICAL RECORD NO.:  [id number]  LOCATION:  [facility]                          FACILITY:  [facility]  PHYSICIAN:  J. Darreld McleanWayne Cloyde Oregel, M.D. DATE OF BIRTH:  1950-11-21  DATE OF PROCEDURE: DATE OF DISCHARGE:                              OPERATIVE REPORT   PREOPERATIVE DIAGNOSIS:  Carpal tunnel syndrome, left.  POSTOPERATIVE DIAGNOSIS:  Carpal tunnel syndrome, left.  PROCEDURE:  Open release of volar carpal ligament, saline neurolysis and epineurotomy, left median nerve.  ANESTHESIA:  Regional Bier block.  Please refer to anesthesia record for tourniquet time.  DRAINS:  None.  SURGEON:  J. Darreld McleanWayne Reham Slabaugh, MD  INDICATIONS:  The patient has had carpal tunnel syndrome in both hands. She recently had surgery on the right and has done well.  She is now for surgery on the left.  In the past, she has had positive EMGs and very little response to conservative treatment.  She is familiar with the procedure having had on the right hand, now she wants to have it done on the left.  Risks and imponderables have been discussed.  The patient agrees to procedure as outlined.  She knows this is an outpatient procedure.  DESCRIPTION OF PROCEDURE:  The patient was seen in the holding area. The left hand was identified as correct surgical site, a mark was placed over the volar left wrist.  The patient brought to the operating room, placed supine with hand table attached.  She was given regional Bier block anesthesia.  She was then prepped and draped in the usual manner. We had a generalized time-out identifying the patient as [name] and we are doing her left hand for a volar carpal tunnel syndrome.  All instrumentation was properly positioned.  OR team knew each other.  A [place] incision was made.  Very careful dissection, the median nerve was identified proximally.  Vessel loop placed around the nerve.  A groove director was then placed  within the carpal tunnel space and the volar carpal ligament incised.  Specimen of volar carpal ligament sent to pathology.  Retinaculum cut proximally.  The nerve had obvious compression.  Saline neurolysis epineurotomy carried out.  The nerve was inspected and no apparent injury, vessel loop removed.  Wounds were reapproximated using 3-0 nylon interrupted vertical mattress manner. Sterile dressing applied.  Bulky dressing applied.  Sheet cotton applied.  Sheet cotton cut dorsally.  Ace bandage applied loosely.  The patient tolerated the procedure well with recovery in good condition.  She was given appropriate analgesia for pain.  If she has any difficulties, she is to contact me through the office hospital beeper system.  Otherwise, I will see her in the office in 1 week.          ______________________________ J. Darreld McleanWayne Ardean Simonich, M.D.     JWK/MEDQ  D:  11/02/2013  T:  11/03/2013  Job:  [id number]

## 2013-11-04 ENCOUNTER — Encounter (HOSPITAL_COMMUNITY): Payer: Self-pay | Admitting: Orthopaedic Surgery

## 2017-06-26 ENCOUNTER — Ambulatory Visit (INDEPENDENT_AMBULATORY_CARE_PROVIDER_SITE_OTHER): Payer: Medicare Other | Admitting: Otolaryngology

## 2017-06-26 DIAGNOSIS — J343 Hypertrophy of nasal turbinates: Secondary | ICD-10-CM | POA: Diagnosis not present

## 2017-06-26 DIAGNOSIS — J310 Chronic rhinitis: Secondary | ICD-10-CM

## 2017-06-26 DIAGNOSIS — H6983 Other specified disorders of Eustachian tube, bilateral: Secondary | ICD-10-CM | POA: Diagnosis not present

## 2017-08-07 ENCOUNTER — Other Ambulatory Visit (INDEPENDENT_AMBULATORY_CARE_PROVIDER_SITE_OTHER): Payer: Self-pay | Admitting: Otolaryngology

## 2017-08-07 ENCOUNTER — Ambulatory Visit (INDEPENDENT_AMBULATORY_CARE_PROVIDER_SITE_OTHER): Payer: Medicare Other | Admitting: Otolaryngology

## 2017-08-07 DIAGNOSIS — J310 Chronic rhinitis: Secondary | ICD-10-CM | POA: Diagnosis not present

## 2017-08-07 DIAGNOSIS — J330 Polyp of nasal cavity: Secondary | ICD-10-CM | POA: Diagnosis not present

## 2017-08-07 DIAGNOSIS — J329 Chronic sinusitis, unspecified: Secondary | ICD-10-CM

## 2017-08-07 DIAGNOSIS — J343 Hypertrophy of nasal turbinates: Secondary | ICD-10-CM | POA: Diagnosis not present

## 2017-08-14 ENCOUNTER — Ambulatory Visit (HOSPITAL_COMMUNITY)
Admission: RE | Admit: 2017-08-14 | Discharge: 2017-08-14 | Disposition: A | Discharge status: Home / Self Care | Payer: Medicare Other | Source: Ambulatory Visit | Attending: Otolaryngology | Admitting: Otolaryngology

## 2017-08-14 DIAGNOSIS — J3489 Other specified disorders of nose and nasal sinuses: Secondary | ICD-10-CM | POA: Insufficient documentation

## 2017-08-14 DIAGNOSIS — J329 Chronic sinusitis, unspecified: Secondary | ICD-10-CM | POA: Insufficient documentation

## 2017-08-28 ENCOUNTER — Ambulatory Visit (INDEPENDENT_AMBULATORY_CARE_PROVIDER_SITE_OTHER): Payer: Medicare Other | Admitting: Otolaryngology

## 2017-08-28 DIAGNOSIS — J338 Other polyp of sinus: Secondary | ICD-10-CM | POA: Diagnosis not present

## 2017-08-28 DIAGNOSIS — J320 Chronic maxillary sinusitis: Secondary | ICD-10-CM

## 2017-08-28 DIAGNOSIS — J322 Chronic ethmoidal sinusitis: Secondary | ICD-10-CM | POA: Diagnosis not present

## 2017-08-28 DIAGNOSIS — J342 Deviated nasal septum: Secondary | ICD-10-CM

## 2019-03-28 IMAGING — CT CT MAXILLOFACIAL W/O CM
3 series · 11 of 47 positions shown, 13 images · non-contrast
Comparison: None.

CLINICAL DATA: Chronic sinusitis.

EXAM:
CT MAXILLOFACIAL WITHOUT CONTRAST
TECHNIQUE: Multidetector CT images of the paranasal sinuses were obtained using
the standard protocol without intravenous contrast.

[Series 2: standard · axial · 0.37mm/px · z∈[-8,+68]mm · 5 of 98 slices shown, 7 images]
[im 11/98  brain]
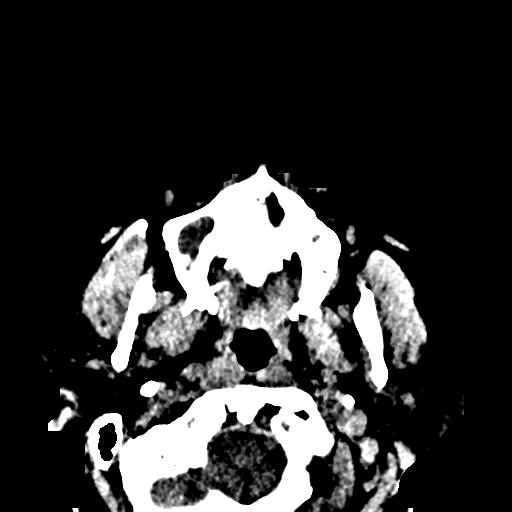
[im 11/98  bone]
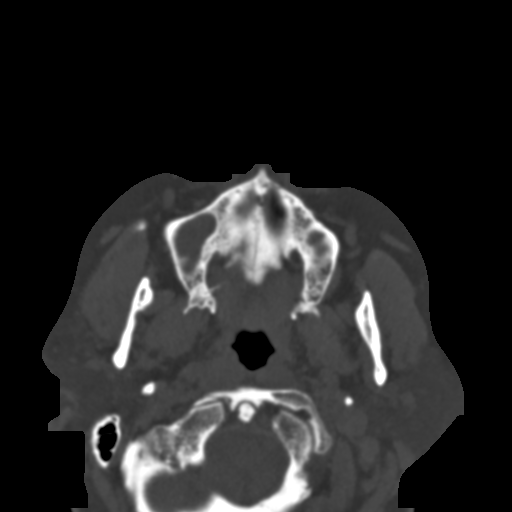
[im 31/98  bone]
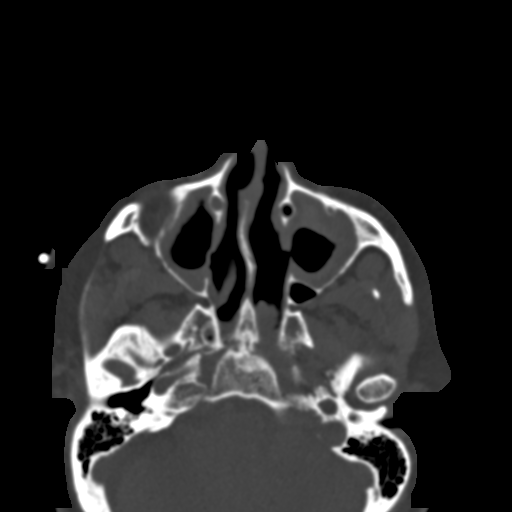
[im 51/98  bone]
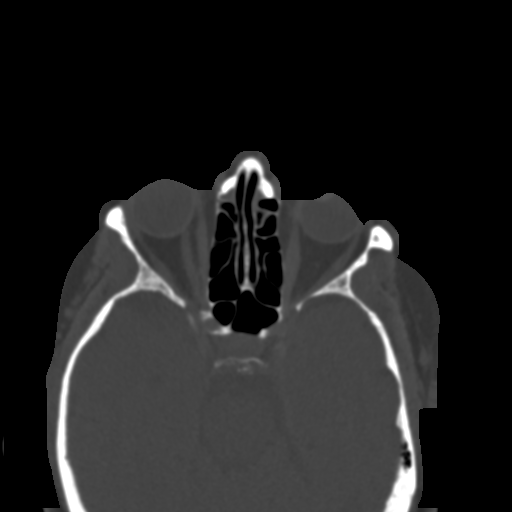
[im 67/98  bone]
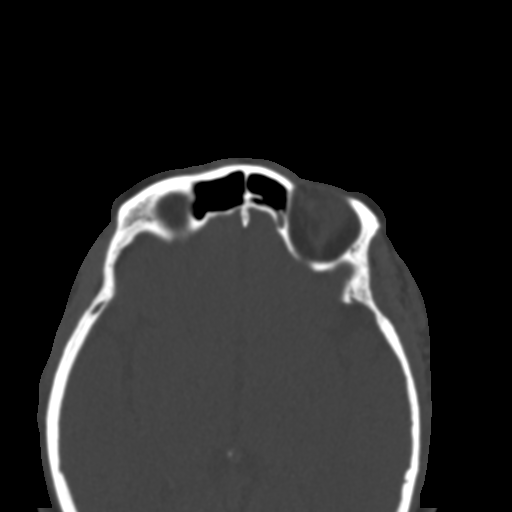
[im 87/98  brain]
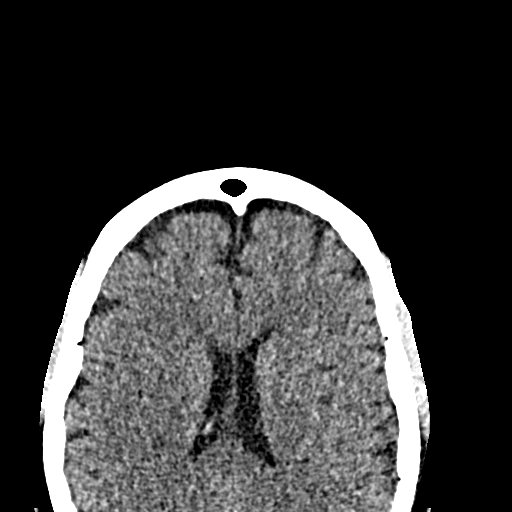
[im 87/98  bone]
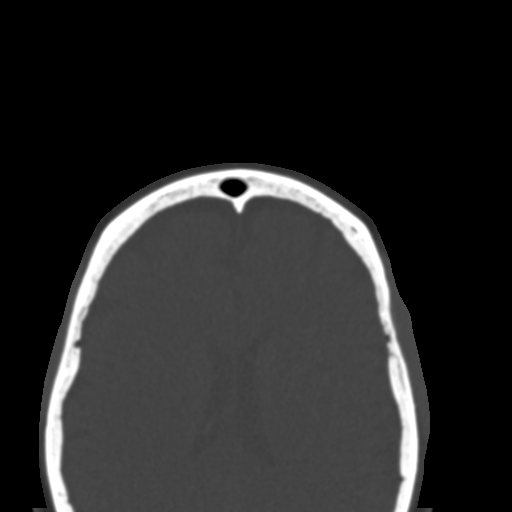

[Series 4: coronal · coronal · 0.19mm/px · 3 of 79 slices shown]
[im 27/79  bone]
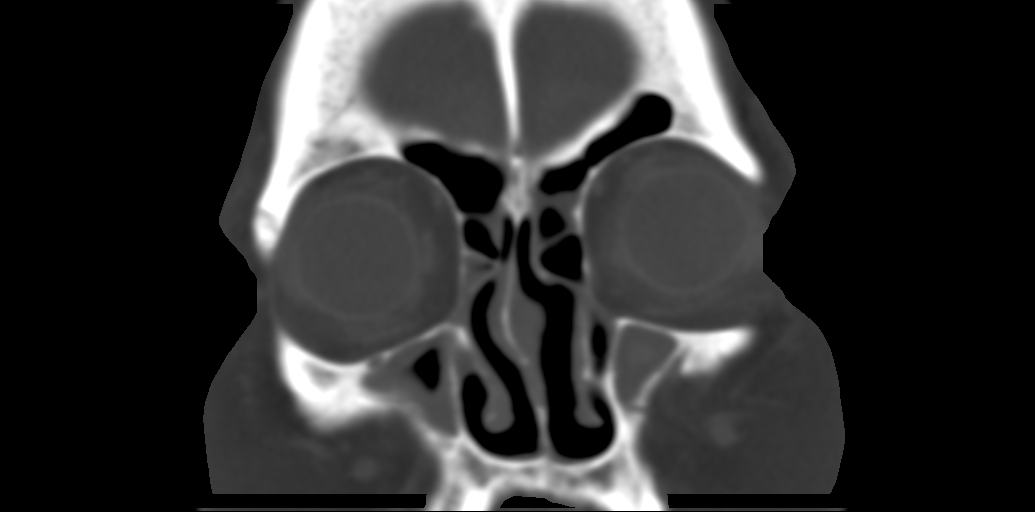
[im 35/79  bone]
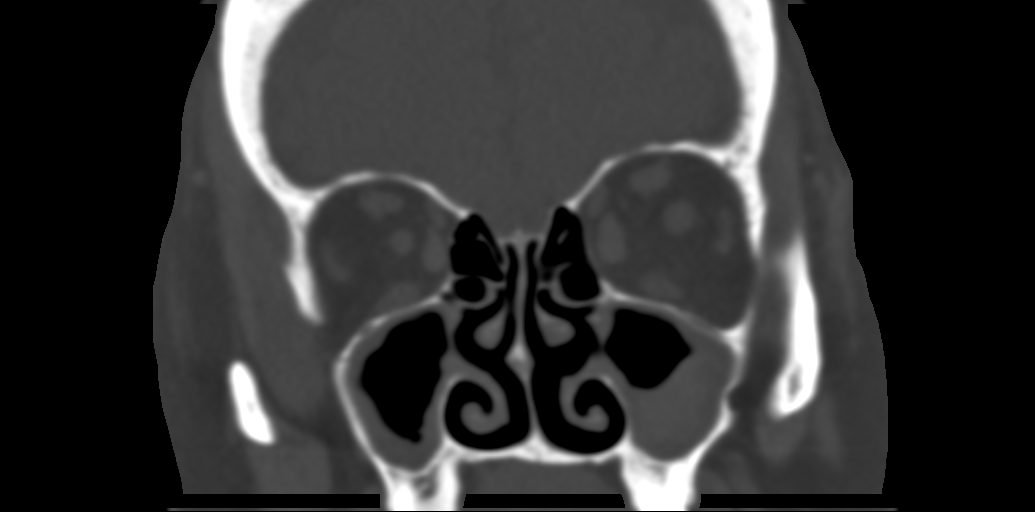
[im 44/79  bone]
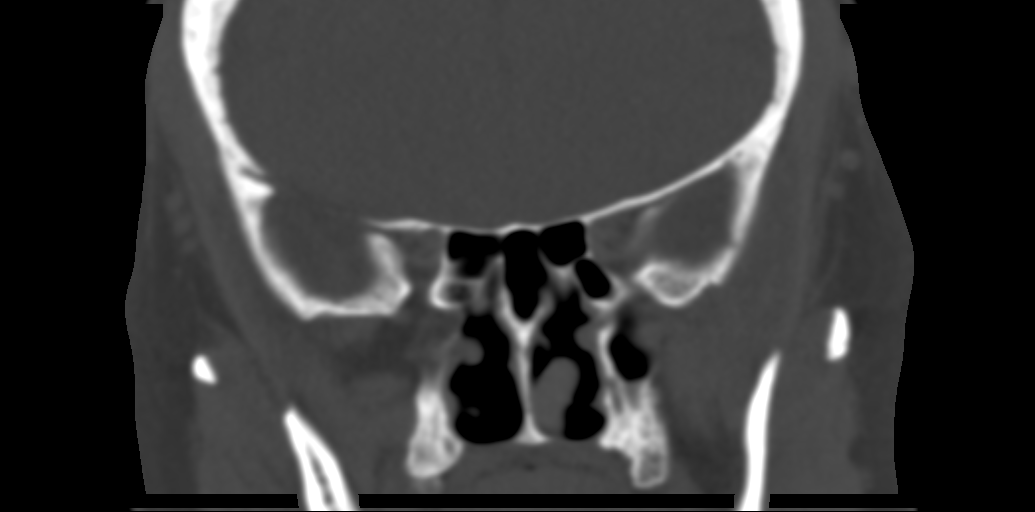

[Series 5: sagittal · sagittal · 0.19mm/px · 3 of 101 slices shown]
[im 34/101  bone]
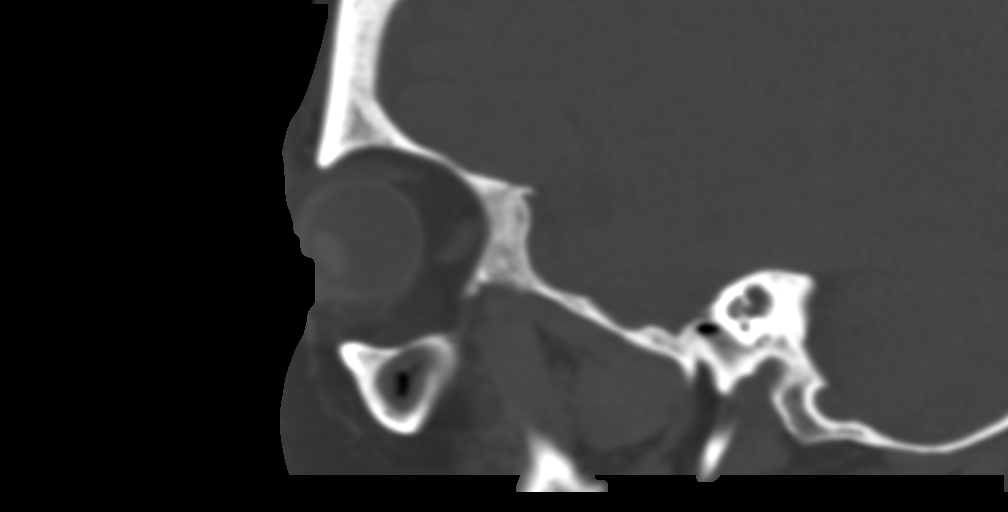
[im 51/101  bone]
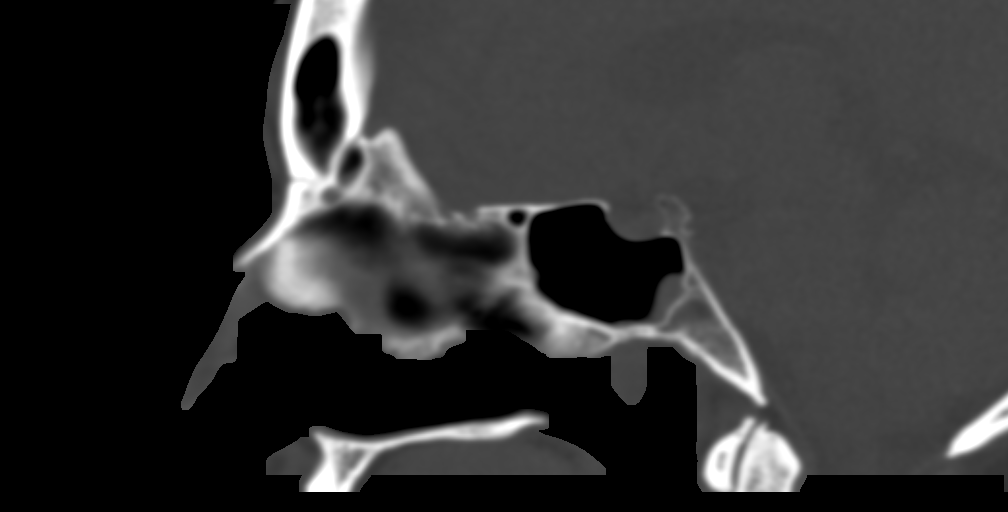
[im 67/101  bone]
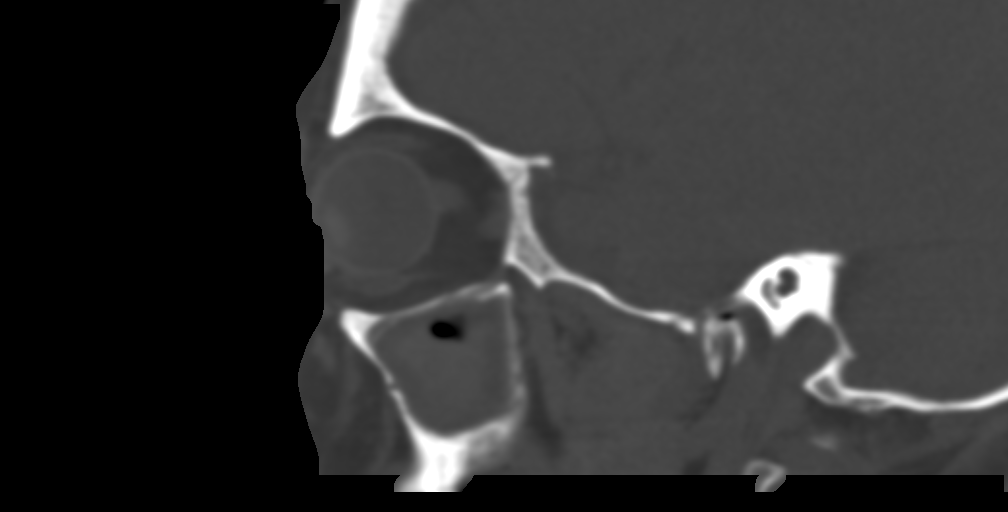

[11 of 47 positions shown; findings below may reference images not displayed]

FINDINGS: Paranasal sinuses:

Frontal: Mucosal thickening at the frontal ethmoid junctions, more
on the left than the right. Bodies of the frontal sinuses are clear.

Ethmoid: Scattered opacified ethmoid air cells, particularly
anteriorly, left more than right.

Maxillary: Bilateral maxillary sinus inflammatory change with
circumferential mucosal thickening and a small amount of layering
fluid.

Sphenoid: Mild mucosal thickening, right division more than left.

Right ostiomeatal unit: Obscured by mucosal thickening. No sign of
underlying anatomic variant.

Left ostiomeatal unit: Obscured by mucosal thickening. No sign of
underlying anatomic variant.

Nasal passages: Anterior nasal passages appear normal. There appears
to be a mass in the posterior nasal passages/nasopharynx projecting
more towards the left measuring up to 2.6 cm in diameter. Nasal
septum bows mildly towards the right with a small right spur.

Anatomy: No pneumatization superior to anterior ethmoid notches.
Symmetric and intact olfactory grooves and fovea ethmoidalis, Keros
II (4-7mm) Sellar sphenoid pneumatization pattern. No dehiscence of
carotid or optic canals. No onodi cell.

Other: None
IMPRESSION: Sinusitis, most pronounced affecting the maxillary sinuses. Some
involvement also at the anterior ethmoid sinuses and sphenoid sinus
as outlined above.

Apparent posterior nasal passage/nasopharyngeal mass or polyp
projecting more towards the left, measuring up to 2.5 cm in
diameter.

## 2019-11-25 ENCOUNTER — Ambulatory Visit: Payer: Medicare PPO | Attending: Internal Medicine

## 2019-11-25 DIAGNOSIS — Z23 Encounter for immunization: Secondary | ICD-10-CM

## 2019-11-25 NOTE — Progress Notes (Signed)
   Covid-19 Vaccination Clinic  Name:  Janice [name]    MRN: [id number] DOB: 03-Oct-1950  11/25/2019  [name] was observed post Covid-19 immunization for 15 minutes without incident. She was provided with Vaccine Information Sheet and instruction to access the V-Safe system.   Ms. Plate was instructed to call 911 with any severe reactions post vaccine: Marland Kitchen Difficulty breathing  . Swelling of face and throat  . A fast heartbeat  . A bad rash all over body  . Dizziness and weakness   Immunizations Administered    Name Date Dose VIS Date Route   Moderna COVID-19 Vaccine 11/25/2019  8:08 AM 0.5 mL 06/2019 Intramuscular   Manufacturer: Moderna   Lot: [id number]   NDC: 86148-307-35

## 2019-12-23 ENCOUNTER — Ambulatory Visit: Payer: PRIVATE HEALTH INSURANCE | Attending: Internal Medicine

## 2019-12-23 ENCOUNTER — Ambulatory Visit: Payer: PRIVATE HEALTH INSURANCE

## 2019-12-23 DIAGNOSIS — Z23 Encounter for immunization: Secondary | ICD-10-CM

## 2019-12-23 NOTE — Progress Notes (Signed)
   Covid-19 Vaccination Clinic  Name:  Janice [name]    MRN: [id number] DOB: Nov 18, 1950  12/23/2019  [name] was observed post Covid-19 immunization for 15 minutes without incident. She was provided with Vaccine Information Sheet and instruction to access the V-Safe system.   [name] was instructed to call 911 with any severe reactions post vaccine: Marland Kitchen Difficulty breathing  . Swelling of face and throat  . A fast heartbeat  . A bad rash all over body  . Dizziness and weakness   Immunizations Administered    Name Date Dose VIS Date Route   Moderna COVID-19 Vaccine 12/23/2019  8:55 AM 0.5 mL 06/2019 Intramuscular   Manufacturer: Moderna   Lot: [id number]   NDC: 05259-102-89

## 2022-11-19 ENCOUNTER — Other Ambulatory Visit: Payer: Self-pay | Admitting: Otolaryngology

## 2022-11-19 DIAGNOSIS — J329 Chronic sinusitis, unspecified: Secondary | ICD-10-CM

## 2022-12-23 ENCOUNTER — Ambulatory Visit
Admission: RE | Admit: 2022-12-23 | Discharge: 2022-12-23 | Disposition: A | Discharge status: Home / Self Care | Payer: Medicare PPO | Source: Ambulatory Visit | Attending: Otolaryngology | Admitting: Otolaryngology

## 2022-12-23 DIAGNOSIS — J329 Chronic sinusitis, unspecified: Secondary | ICD-10-CM

## 2023-02-05 ENCOUNTER — Other Ambulatory Visit: Payer: Self-pay | Admitting: Otolaryngology

## 2023-02-17 ENCOUNTER — Encounter (HOSPITAL_BASED_OUTPATIENT_CLINIC_OR_DEPARTMENT_OTHER): Payer: Self-pay | Admitting: Otolaryngology

## 2023-02-18 ENCOUNTER — Other Ambulatory Visit: Payer: Self-pay

## 2023-02-18 ENCOUNTER — Encounter (HOSPITAL_BASED_OUTPATIENT_CLINIC_OR_DEPARTMENT_OTHER): Payer: Self-pay | Admitting: Otolaryngology

## 2023-02-18 ENCOUNTER — Encounter (HOSPITAL_BASED_OUTPATIENT_CLINIC_OR_DEPARTMENT_OTHER)
Admission: RE | Admit: 2023-02-18 | Discharge: 2023-02-18 | Disposition: A | Discharge status: Home / Self Care | Payer: PRIVATE HEALTH INSURANCE | Source: Ambulatory Visit | Attending: Otolaryngology | Admitting: Otolaryngology

## 2023-02-18 DIAGNOSIS — Z01818 Encounter for other preprocedural examination: Secondary | ICD-10-CM | POA: Diagnosis present

## 2023-02-18 LAB — BASIC METABOLIC PANEL
Anion gap: 7 (ref 5–15)
BUN: 10 mg/dL (ref 8–23)
CO2: 25 mmol/L (ref 22–32)
Calcium: 9.1 mg/dL (ref 8.9–10.3)
Chloride: 107 mmol/L (ref 98–111)
Creatinine, Ser: 0.88 mg/dL (ref 0.44–1.00)
GFR, Estimated: >60 mL/min (ref >60)
Glucose, Bld: 129 mg/dL — ABNORMAL HIGH (ref 70–99)
Potassium: 3.9 mmol/L (ref 3.5–5.1)
Sodium: 139 mmol/L (ref 135–145)

## 2023-02-24 ENCOUNTER — Encounter (HOSPITAL_BASED_OUTPATIENT_CLINIC_OR_DEPARTMENT_OTHER)
Admission: RE | Disposition: A | Discharge status: Home / Self Care | Payer: Self-pay | Source: Home / Self Care | Attending: Otolaryngology

## 2023-02-24 ENCOUNTER — Ambulatory Visit (HOSPITAL_BASED_OUTPATIENT_CLINIC_OR_DEPARTMENT_OTHER): Payer: Medicare PPO | Admitting: Anesthesiology

## 2023-02-24 ENCOUNTER — Other Ambulatory Visit: Payer: Self-pay

## 2023-02-24 ENCOUNTER — Encounter (HOSPITAL_BASED_OUTPATIENT_CLINIC_OR_DEPARTMENT_OTHER): Payer: Self-pay | Admitting: Otolaryngology

## 2023-02-24 ENCOUNTER — Ambulatory Visit (HOSPITAL_BASED_OUTPATIENT_CLINIC_OR_DEPARTMENT_OTHER)
Admission: RE | Admit: 2023-02-24 | Discharge: 2023-02-24 | Disposition: A | Discharge status: Home / Self Care | Payer: Medicare PPO | Attending: Otolaryngology | Admitting: Otolaryngology

## 2023-02-24 DIAGNOSIS — J322 Chronic ethmoidal sinusitis: Secondary | ICD-10-CM

## 2023-02-24 DIAGNOSIS — J343 Hypertrophy of nasal turbinates: Secondary | ICD-10-CM | POA: Insufficient documentation

## 2023-02-24 DIAGNOSIS — Z01818 Encounter for other preprocedural examination: Secondary | ICD-10-CM

## 2023-02-24 DIAGNOSIS — J339 Nasal polyp, unspecified: Secondary | ICD-10-CM | POA: Diagnosis not present

## 2023-02-24 DIAGNOSIS — J320 Chronic maxillary sinusitis: Secondary | ICD-10-CM | POA: Diagnosis not present

## 2023-02-24 DIAGNOSIS — E119 Type 2 diabetes mellitus without complications: Secondary | ICD-10-CM | POA: Diagnosis not present

## 2023-02-24 DIAGNOSIS — Z6841 Body Mass Index (BMI) 40.0 and over, adult: Secondary | ICD-10-CM | POA: Diagnosis not present

## 2023-02-24 DIAGNOSIS — J338 Other polyp of sinus: Secondary | ICD-10-CM | POA: Diagnosis not present

## 2023-02-24 DIAGNOSIS — I10 Essential (primary) hypertension: Secondary | ICD-10-CM | POA: Insufficient documentation

## 2023-02-24 DIAGNOSIS — J3489 Other specified disorders of nose and nasal sinuses: Secondary | ICD-10-CM | POA: Diagnosis not present

## 2023-02-24 HISTORY — DX: Allergic rhinitis, unspecified: J30.9

## 2023-02-24 HISTORY — PX: MAXILLARY ANTROSTOMY: SHX2003

## 2023-02-24 HISTORY — PX: TURBINATE REDUCTION: SHX6157

## 2023-02-24 HISTORY — PX: ETHMOIDECTOMY: SHX5197

## 2023-02-24 HISTORY — DX: Anxiety disorder, unspecified: F41.9

## 2023-02-24 HISTORY — PX: NASAL SINUS SURGERY: SHX719

## 2023-02-24 SURGERY — REDUCTION, NASAL TURBINATE
Anesthesia: General | Site: Nose | Laterality: Bilateral

## 2023-02-24 MED ORDER — LIDOCAINE 2% (20 MG/ML) 5 ML SYRINGE
INTRAMUSCULAR | Status: AC
  Filled 2023-02-24: qty 5

## 2023-02-24 MED ORDER — ROCURONIUM BROMIDE 10 MG/ML (PF) SYRINGE
PREFILLED_SYRINGE | INTRAVENOUS | Status: AC
  Filled 2023-02-24: qty 10

## 2023-02-24 MED ORDER — DEXAMETHASONE SODIUM PHOSPHATE 4 MG/ML IJ SOLN
INTRAMUSCULAR | Status: DC | PRN
  Administered 2023-02-24: 10 mg via INTRAVENOUS

## 2023-02-24 MED ORDER — OXYMETAZOLINE HCL 0.05 % NA SOLN
NASAL | Status: DC | PRN
  Administered 2023-02-24: 1 via TOPICAL

## 2023-02-24 MED ORDER — ONDANSETRON HCL 4 MG/2ML IJ SOLN
INTRAMUSCULAR | Status: AC
  Filled 2023-02-24: qty 2

## 2023-02-24 MED ORDER — SODIUM CHLORIDE 0.9 % IR SOLN
Status: DC | PRN
Start: 2023-02-24 — End: 2023-02-24
  Administered 2023-02-24: 1

## 2023-02-24 MED ORDER — FENTANYL CITRATE (PF) 100 MCG/2ML IJ SOLN
INTRAMUSCULAR | Status: AC
  Filled 2023-02-24: qty 2

## 2023-02-24 MED ORDER — LACTATED RINGERS IV SOLN: INTRAVENOUS | Status: DC

## 2023-02-24 MED ORDER — ROCURONIUM BROMIDE 100 MG/10ML IV SOLN
INTRAVENOUS | Status: DC | PRN
  Administered 2023-02-24: 60 mg via INTRAVENOUS

## 2023-02-24 MED ORDER — PROPOFOL 10 MG/ML IV BOLUS
INTRAVENOUS | Status: AC
  Filled 2023-02-24: qty 20

## 2023-02-24 MED ORDER — SUGAMMADEX SODIUM 200 MG/2ML IV SOLN
INTRAVENOUS | Status: DC | PRN
  Administered 2023-02-24: 400 mg via INTRAVENOUS

## 2023-02-24 MED ORDER — MUPIROCIN 2 % EX OINT
TOPICAL_OINTMENT | CUTANEOUS | Status: DC | PRN
  Administered 2023-02-24: 1 via TOPICAL

## 2023-02-24 MED ORDER — PROPOFOL 10 MG/ML IV BOLUS
INTRAVENOUS | Status: DC | PRN
  Administered 2023-02-24: 150 mg via INTRAVENOUS

## 2023-02-24 MED ORDER — PHENYLEPHRINE HCL (PRESSORS) 10 MG/ML IV SOLN
INTRAVENOUS | Status: DC | PRN
  Administered 2023-02-24: 160 ug via INTRAVENOUS
  Administered 2023-02-24: 80 ug via INTRAVENOUS
  Administered 2023-02-24: 160 ug via INTRAVENOUS
  Administered 2023-02-24 (×3): 80 ug via INTRAVENOUS

## 2023-02-24 MED ORDER — OXYMETAZOLINE HCL 0.05 % NA SOLN
NASAL | Status: AC
  Filled 2023-02-24: qty 30

## 2023-02-24 MED ORDER — ACETAMINOPHEN 500 MG PO TABS
1000.0000 mg | ORAL_TABLET | Freq: Once | ORAL | Status: AC
  Administered 2023-02-24: 1000 mg via ORAL

## 2023-02-24 MED ORDER — DEXAMETHASONE SODIUM PHOSPHATE 10 MG/ML IJ SOLN
INTRAMUSCULAR | Status: AC
  Filled 2023-02-24: qty 1

## 2023-02-24 MED ORDER — ACETAMINOPHEN 500 MG PO TABS
ORAL_TABLET | ORAL | Status: AC
  Filled 2023-02-24: qty 2

## 2023-02-24 MED ORDER — LIDOCAINE HCL (CARDIAC) PF 100 MG/5ML IV SOSY
PREFILLED_SYRINGE | INTRAVENOUS | Status: DC | PRN
  Administered 2023-02-24: 100 mg via INTRAVENOUS

## 2023-02-24 MED ORDER — OXYCODONE HCL 5 MG/5ML PO SOLN: 5.0000 mg | Freq: Once | ORAL | Status: DC | PRN

## 2023-02-24 MED ORDER — ONDANSETRON HCL 4 MG/2ML IJ SOLN
INTRAMUSCULAR | Status: DC | PRN
  Administered 2023-02-24: 4 mg via INTRAVENOUS

## 2023-02-24 MED ORDER — OXYCODONE HCL 5 MG PO TABS: 5.0000 mg | ORAL_TABLET | Freq: Once | ORAL | Status: DC | PRN

## 2023-02-24 MED ORDER — CEFAZOLIN SODIUM-DEXTROSE 1-4 GM/50ML-% IV SOLN
INTRAVENOUS | Status: DC | PRN
  Administered 2023-02-24: 2 g via INTRAVENOUS

## 2023-02-24 MED ORDER — HYDROMORPHONE HCL 1 MG/ML IJ SOLN: 0.2500 mg | INTRAMUSCULAR | Status: DC | PRN

## 2023-02-24 MED ORDER — ONDANSETRON HCL 4 MG/2ML IJ SOLN: 4.0000 mg | Freq: Once | INTRAMUSCULAR | Status: DC | PRN

## 2023-02-24 MED ORDER — AZITHROMYCIN 500 MG PO TABS: 500.0000 mg | ORAL_TABLET | Freq: Every day | ORAL | 0 refills | Status: AC

## 2023-02-24 MED ORDER — FENTANYL CITRATE (PF) 100 MCG/2ML IJ SOLN
INTRAMUSCULAR | Status: DC | PRN
  Administered 2023-02-24: 50 ug via INTRAVENOUS

## 2023-02-24 SURGICAL SUPPLY — 51 items
ATTRACTOMAT 16X20 MAGNETIC DRP (DRAPES) IMPLANT
BLADE RAD40 ROTATE 4M 4 5PK (BLADE) IMPLANT
BLADE RAD60 ROTATE M4 4 5PK (BLADE) IMPLANT
BLADE ROTATE RAD 12 4 M4 (BLADE) IMPLANT
BLADE ROTATE RAD 40 4 M4 (BLADE) IMPLANT
BLADE ROTATE TRICUT 4X13 M4 (BLADE) ×3 IMPLANT
BLADE TRICUT ROTATE M4 4 5PK (BLADE) IMPLANT
BUR HS RAD FRONTAL 3 (BURR) IMPLANT
CANISTER SUC SOCK COL 7IN (MISCELLANEOUS) ×5 IMPLANT
CANISTER SUCT 1200ML W/VALVE (MISCELLANEOUS) ×6 IMPLANT
COAGULATOR SUCT 8FR VV (MISCELLANEOUS) ×1 IMPLANT
DEFOGGER MIRROR 1QT (MISCELLANEOUS) ×3 IMPLANT
DRSG NASAL KENNEDY LMNT 8CM (GAUZE/BANDAGES/DRESSINGS) IMPLANT
DRSG NASOPORE 8CM (GAUZE/BANDAGES/DRESSINGS) ×1 IMPLANT
DRSG TELFA 3X8 NADH STRL (GAUZE/BANDAGES/DRESSINGS) IMPLANT
ELECT REM PT RETURN 9FT ADLT (ELECTROSURGICAL) ×2
ELECTRODE REM PT RTRN 9FT ADLT (ELECTROSURGICAL) ×2 IMPLANT
GAUZE SPONGE 2X2 STRL 8-PLY (GAUZE/BANDAGES/DRESSINGS) ×3 IMPLANT
GLOVE BIO SURGEON STRL SZ7 (GLOVE) ×1 IMPLANT
GLOVE BIO SURGEON STRL SZ7.5 (GLOVE) ×3 IMPLANT
GLOVE BIOGEL PI IND STRL 7.0 (GLOVE) ×2 IMPLANT
GLOVE ECLIPSE 6.5 STRL STRAW (GLOVE) ×1 IMPLANT
GOWN STRL REUS W/ TWL LRG LVL3 (GOWN DISPOSABLE) ×7 IMPLANT
GOWN STRL REUS W/TWL LRG LVL3 (GOWN DISPOSABLE) ×6
HEMOSTAT SURGICEL 2X14 (HEMOSTASIS) IMPLANT
IV NS 500ML (IV SOLUTION) ×2
IV NS 500ML BAXH (IV SOLUTION) ×3 IMPLANT
NDL HYPO 25X1 1.5 SAFETY (NEEDLE) IMPLANT
NDL SPNL 25GX3.5 QUINCKE BL (NEEDLE) IMPLANT
NEEDLE HYPO 25X1 1.5 SAFETY (NEEDLE)
NEEDLE SPNL 25GX3.5 QUINCKE BL (NEEDLE)
NS IRRIG 1000ML POUR BTL (IV SOLUTION) ×3 IMPLANT
PACK BASIN DAY SURGERY FS (CUSTOM PROCEDURE TRAY) ×3 IMPLANT
PACK ENT DAY SURGERY (CUSTOM PROCEDURE TRAY) ×3 IMPLANT
PATTIES SURGICAL .5 X3 (DISPOSABLE) IMPLANT
SLEEVE SCD COMPRESS KNEE MED (STOCKING) ×3 IMPLANT
SPIKE FLUID TRANSFER (MISCELLANEOUS) IMPLANT
SPLINT NASAL AIRWAY SILICONE (MISCELLANEOUS) IMPLANT
SPONGE NEURO XRAY DETECT 1X3 (DISPOSABLE) ×3 IMPLANT
SUCTION TUBE FRAZIER 10FR DISP (SUCTIONS) IMPLANT
SUT CHROMIC 4 0 P 3 18 (SUTURE) IMPLANT
SUT PLAIN 4 0 ~~LOC~~ 1 (SUTURE) IMPLANT
SUT PROLENE 3 0 PS 2 (SUTURE) IMPLANT
SYR 50ML LL SCALE MARK (SYRINGE) ×3 IMPLANT
TOWEL GREEN STERILE FF (TOWEL DISPOSABLE) ×4 IMPLANT
TRACKER ENT INSTRUMENT (MISCELLANEOUS) ×3 IMPLANT
TRACKER ENT PATIENT (MISCELLANEOUS) ×3 IMPLANT
TUBE CONNECTING 20X1/4 (TUBING) ×3 IMPLANT
TUBE SALEM SUMP 16F (TUBING) ×3 IMPLANT
TUBING STRAIGHTSHOT EPS 5PK (TUBING) ×3 IMPLANT
YANKAUER SUCT BULB TIP NO VENT (SUCTIONS) ×3 IMPLANT

## 2023-02-24 NOTE — H&P (Signed)
Cc: Chronic rhinosinusitis and polyposis, chronic nasal obstruction  HPI: The patient is a 72 year old female who returns today for her follow-up evaluation.  The patient has a history of chronic rhinosinusitis, chronic nasal congestion, and bilateral nasal polyps.  She was previously noted to have a septal perforation and polypoid tissue obstructing her nasal cavities bilaterally.  Her CT scan also showed paranasal sinus diseases, involving bilateral maxillary and ethmoid sinuses.  The patient was treated with multiple courses of antibiotics, topical and systemic steroids.  The patient returns today reporting no improvement in her symptoms.  She is still chronically obstructed.  She has frequent facial pressure and discomfort.  She denies any fever or visual change.  Exam: General: Communicates without difficulty, well nourished, no acute distress. Head: Normocephalic, no evidence injury, no tenderness, facial buttresses intact without stepoff. Eyes: PERRL, EOMI. No scleral icterus, conjunctivae clear. Neuro: CN II exam reveals vision grossly intact.  No nystagmus at any point of gaze. Ears: Auricles well formed without lesions.  Ear canals are intact without mass or lesion.  No erythema or edema is appreciated.  The TMs are intact without fluid. Nose: External evaluation reveals normal support and skin without lesions.  Dorsum is intact.  Anterior rhinoscopy reveals congested and edematous mucosa over anterior aspect of the inferior turbinates and nasal septum.  No purulence is noted. Middle meatus is not well visualized. Oral:  Oral cavity and oropharynx are intact, symmetric, without erythema or edema.  Mucosa is moist without lesions. Neck: Full range of motion without pain.  There is no significant lymphadenopathy.  No masses palpable.  Thyroid bed within normal limits to palpation.  Parotid glands and submandibular glands equal bilaterally without mass.  Trachea is midline. Neuro:  CN 2-12 grossly  intact. Gait normal. Vestibular: No nystagmus at any point of gaze. A flexible scope was inserted into the right nasal cavity.  Endoscopy of the interior nasal cavity, superior, inferior, and middle meatus was performed. The sphenoid-ethmoid recess was examined. Edematous mucosa was noted.  Polypoid tissue obstructing the posterior nasal cavity.  Nasal septal perforation noted.  Turbinates were hypertrophied but without mass. The procedure was repeated on the contralateral side with similar findings.  The patient tolerated the procedure well.   Assessment: 1.  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 2.  Nasal septal perforation. 3.  Bilateral posterior nasal polyps. 4.  The patient has not responded to medical therapy.  Plan: 1.  The nasal endoscopy findings and the CT images are reviewed with the patient. 2.  Continue with Flonase nasal spray 2 sprays each nostril daily. 3.  In light of her persistent symptoms, she may benefit from surgical intervention with bilateral endoscopic sinus surgery, polypectomy, and bilateral turbinate reduction.  The risk, benefits, and details of the procedures are reviewed.  Questions are invited and answered. 4.  The patient would like to proceed with the procedures.

## 2023-02-24 NOTE — Discharge Instructions (Addendum)
No Tylenol until after 2pm today if needed  Post Anesthesia Home Care Instructions  Activity: Get plenty of rest for the remainder of the day. A responsible individual must stay with you for 24 hours following the procedure.  For the next 24 hours, DO NOT: -Drive a car -Advertising copywriter -Drink alcoholic beverages -Take any medication unless instructed by your physician -Make any legal decisions or sign important papers.  Meals: Start with liquid foods such as gelatin or soup. Progress to regular foods as tolerated. Avoid greasy, spicy, heavy foods. If nausea and/or vomiting occur, drink only clear liquids until the nausea and/or vomiting subsides. Call your physician if vomiting continues.  Special Instructions/Symptoms: Your throat may feel dry or sore from the anesthesia or the breathing tube placed in your throat during surgery. If this causes discomfort, gargle with warm salt water. The discomfort should disappear within 24 hours.  If you had a scopolamine patch placed behind your ear for the management of post- operative nausea and/or vomiting:  1. The medication in the patch is effective for 72 hours, after which it should be removed.  Wrap patch in a tissue and discard in the trash. Wash hands thoroughly with soap and water. 2. You may remove the patch earlier than 72 hours if you experience unpleasant side effects which may include dry mouth, dizziness or visual disturbances. 3. Avoid touching the patch. Wash your hands with soap and water after contact with the patch.     ------------  POSTOPERATIVE INSTRUCTIONS FOR PATIENTS HAVING NASAL OR SINUS OPERATIONS ACTIVITY: Restrict activity at home for the first two days, resting as much as possible. Light activity is best. You may usually return to work within a week. You should refrain from nose blowing, strenuous activity, or heavy lifting greater than 20lbs for a total of one week after your operation.  If sneezing cannot be  avoided, sneeze with your mouth open. DISCOMFORT: You may experience a dull headache and pressure along with nasal congestion and discharge. These symptoms may be worse during the first week after the operation but may last as long as two to four weeks.  Please take Tylenol or the pain medication that has been prescribed for you. Do not take aspirin or aspirin containing medications since they may cause bleeding.  You may experience symptoms of post nasal drainage, nasal congestion, headaches and fatigue for two or three months after your operation.  BLEEDING: You may have some blood tinged nasal drainage for approximately two weeks after the operation.  The discharge will be worse for the first week.  Please call our office at [phone] or go to the nearest hospital emergency room if you experience any of the following: heavy, bright red blood from your nose or mouth that lasts longer than 15 minutes or coughing up or vomiting bright red blood or blood clots. GENERAL CONSIDERATIONS: A gauze dressing will be placed on your upper lip to absorb any drainage after the operation. You may need to change this several times a day.  If you do not have very much drainage, you may remove the dressing.  Remember that you may gently wipe your nose with a tissue and sniff in, but DO NOT blow your nose. Please keep all of your postoperative appointments.  Your final results after the operation will depend on proper follow-up.  The initial visit is usually 2 to 5 days after the operation.  During this visit, the remaining nasal packing and internal septal splints will be  removed.  Your nasal and sinus cavities will be cleaned.  During the second visit, your nasal and sinus cavities will be cleaned again. Have someone drive you to your first two postoperative appointments.  How you care for your nose after the operation will influence the results that you obtain.  You should follow all directions, take your medication as  prescribed, and call our office [phone] with any problems or questions. You may be more comfortable sleeping with your head elevated on two pillows. Do not take any medications that we have not prescribed or recommended. WARNING SIGNS: if any of the following should occur, please call our office: Persistent fever greater than 102F. Persistent vomiting. Severe and constant pain that is not relieved by prescribed pain medication. Trauma to the nose. Rash or unusual side effects from any medicines.

## 2023-02-24 NOTE — Anesthesia Preprocedure Evaluation (Signed)
Anesthesia Evaluation  Patient identified by MRN, date of birth, ID band Patient awake    Reviewed: Allergy & Precautions, H&P , NPO status , Patient's Chart, lab work & pertinent test results  Airway Mallampati: III  TM Distance: <3 FB Neck ROM: Full    Dental no notable dental hx.    Pulmonary neg pulmonary ROS   Pulmonary exam normal breath sounds clear to auscultation       Cardiovascular hypertension, Normal cardiovascular exam Rhythm:Regular Rate:Normal     Neuro/Psych negative neurological ROS  negative psych ROS   GI/Hepatic negative GI ROS, Neg liver ROS,,,  Endo/Other  diabetes  Morbid obesity  Renal/GU negative Renal ROS  negative genitourinary   Musculoskeletal negative musculoskeletal ROS (+)    Abdominal   Peds negative pediatric ROS (+)  Hematology negative hematology ROS (+)   Anesthesia Other Findings   Reproductive/Obstetrics negative OB ROS                             Anesthesia Physical Anesthesia Plan  ASA: 3  Anesthesia Plan: General   Post-op Pain Management: Tylenol PO (pre-op)*   Induction: Intravenous  PONV Risk Score and Plan: 3 and Ondansetron, Dexamethasone and Treatment may vary due to age or medical condition  Airway Management Planned: Oral ETT  Additional Equipment:   Intra-op Plan:   Post-operative Plan: Extubation in OR  Informed Consent: I have reviewed the patients History and Physical, chart, labs and discussed the procedure including the risks, benefits and alternatives for the proposed anesthesia with the patient or authorized representative who has indicated his/her understanding and acceptance.     Dental advisory given  Plan Discussed with: CRNA and Surgeon  Anesthesia Plan Comments:        Anesthesia Quick Evaluation

## 2023-02-24 NOTE — Transfer of Care (Signed)
Immediate Anesthesia Transfer of Care Note  Patient: Janice [name]  Procedure(s) Performed: Frederik Schmidt REDUCTION (Bilateral: Nose) BILATERAL  ENDOSCOPIC TOTAL ETHMOIDECTOMY (Bilateral: Nose) BILATERAL ENDOSCOPIC MAXILLARY ANTROSTOMY WITH TISSUE REMOVAL (Bilateral: Nose) ENDOSCOPIC SINUS SURGERY WITH STEALTH NAVIGATION (Bilateral: Nose)  Patient Location: PACU  Anesthesia Type:General  Level of Consciousness: awake, alert , and patient cooperative  Airway & Oxygen Therapy: oxygen via facemask, spontaneous respirations  Post-op Assessment: Report given to RN and Post -op Vital signs reviewed and stable  Post vital signs: Reviewed and stable  Last Vitals:  Vitals Value Taken Time  BP 122/60 02/24/23 1016  Temp 36.2 C 02/24/23 1015  Pulse 78 02/24/23 1018  Resp 15 02/24/23 1018  SpO2 95 % 02/24/23 1018  Vitals shown include unfiled device data.  Last Pain:  Vitals:   02/24/23 0753  TempSrc: Oral  PainSc: 0-No pain      Patients Stated Pain Goal: 3 (02/24/23 0753)  Complications: No notable events documented.

## 2023-02-24 NOTE — Anesthesia Procedure Notes (Addendum)
Procedure Name: Intubation Date/Time: 02/24/2023 9:18 AM  Performed by: Karen Kitchens, CRNAPre-anesthesia Checklist: Patient identified, Emergency Drugs available, Suction available and Patient being monitored Patient Re-evaluated:Patient Re-evaluated prior to induction Oxygen Delivery Method: Circle system utilized Preoxygenation: Pre-oxygenation with 100% oxygen Induction Type: IV induction Ventilation: Mask ventilation without difficulty Laryngoscope Size: Mac and 4 Grade View: Grade III Tube type: Oral Tube size: 7.5 mm Number of attempts: 1 Airway Equipment and Method: Stylet, Oral airway and Bougie stylet Placement Confirmation: ETT inserted through vocal cords under direct vision, positive ETCO2, breath sounds checked- equal and bilateral and CO2 detector Secured at: 22 cm Tube secured with: Tape Dental Injury: Teeth and Oropharynx as per pre-operative assessment  Difficulty Due To: Difficult Airway- due to large tongue, Difficult Airway- due to anterior larynx and Difficult Airway- due to limited oral opening Future Recommendations: Recommend- induction with short-acting agent, and alternative techniques readily available Comments: Pt was somewhat difficult mask.  Grade 3 view with mac 4 blade and cricoid pressure.  Used a bougie to intubate by lifting the epiglottis with the bougie.  7.5 ett passed atraumatically, bbs and etco2.  Recommend glidescope in the future

## 2023-02-24 NOTE — Anesthesia Postprocedure Evaluation (Signed)
Anesthesia Post Note  Patient: Janice [name]  Procedure(s) Performed: Frederik Schmidt REDUCTION (Bilateral: Nose) BILATERAL  ENDOSCOPIC TOTAL ETHMOIDECTOMY (Bilateral: Nose) BILATERAL ENDOSCOPIC MAXILLARY ANTROSTOMY WITH TISSUE REMOVAL (Bilateral: Nose) ENDOSCOPIC SINUS SURGERY WITH STEALTH NAVIGATION (Bilateral: Nose)     Patient location during evaluation: PACU Anesthesia Type: General Level of consciousness: awake and alert Pain management: pain level controlled Vital Signs Assessment: post-procedure vital signs reviewed and stable Respiratory status: spontaneous breathing, nonlabored ventilation, respiratory function stable and patient connected to nasal cannula oxygen Cardiovascular status: blood pressure returned to baseline and stable Postop Assessment: no apparent nausea or vomiting Anesthetic complications: no  No notable events documented.  Last Vitals:  Vitals:   02/24/23 1045 02/24/23 1106  BP: (!) 115/48 134/62  Pulse: 74 74  Resp: 13 14  Temp:  (!) 36.2 C  SpO2: 96% 95%    Last Pain:  Vitals:   02/24/23 1106  TempSrc: Oral  PainSc:                  [name]

## 2023-02-24 NOTE — Op Note (Signed)
DATE OF PROCEDURE: 02/24/2023  OPERATIVE REPORT   SURGEON: Newman Pies, MD   PREOPERATIVE DIAGNOSES:  1.  Bilateral chronic maxillary and ethmoid sinusitis/polyposis. 2.  Bilateral inferior turbinate hypertrophy. 3.  Bilateral chronic nasal obstruction.  POSTOPERATIVE DIAGNOSES:  1.  Bilateral chronic maxillary and ethmoid sinusitis/polyposis. 2.  Bilateral inferior turbinate hypertrophy. 3.  Bilateral chronic nasal obstruction.  PROCEDURE PERFORMED:  1.  Bilateral partial inferior turbinate resection.  2.  Bilateral endoscopic total ethmoidectomy with polyp removal. 3.  Bilateral endoscopic maxillary antrostomy with polyp removal. 4.  FUSION stereotactic image guidance  ANESTHESIA: General endotracheal tube anesthesia.   COMPLICATIONS: None.   ESTIMATED BLOOD LOSS: 100 mL.   INDICATION FOR PROCEDURE: PENIEL DONATH is a 72 y.o. female with a history of chronic rhinosinusitis, sinonasal polyps, and chronic nasal obstruction. The patient was treated with multiple antibiotics, antihistamine, decongestant, and steroid nasal sprays. However, the patient continued to be symptomatic. On examination, the patient was noted to have bilateral severe inferior turbinate hypertrophy and bilateral sinonasal polyps, causing significant nasal obstruction.  Her CT scan also showed opacification of her maxillary and ethmoid sinuses.  Based on the above findings, the decision was made for the patient to undergo the above-stated procedures. The risks, benefits, alternatives, and details of the procedures were discussed with the patient. Questions were invited and answered. Informed consent was obtained.   DESCRIPTION OF PROCEDURE: The patient was taken to the operating room and placed supine on the operating table. General endotracheal tube anesthesia was administered by the anesthesiologist. The patient was positioned, and prepped and draped in the standard fashion for nasal surgery. Pledgets soaked with Afrin  were placed in both nasal cavities for decongestion. The pledgets were subsequently removed. The FUSION stereotactic image guidance marker was placed. The image guidance system was functional throughout the case.  The inferior one half of both hypertrophied inferior turbinate was crossclamped with a Kelly clamp. The inferior one half of each inferior turbinate was then resected with a pair of cross cutting scissors. Hemostasis was achieved with a suction cautery device.   Using a 0 endoscope, the left nasal cavity was examined. Polypoid tissue was noted within the middle meatus. The polypoid tissue was removed using a combination of microdebrider and Blakesley forceps. The uncinate process was resected with a freer elevator. The maxillary antrum was entered and enlarged using a combination of backbiter and microdebrider. Polypoid tissue was removed from the left maxillary sinus.  Attention was then focused on the ethmoid sinuses. The bony partitions of the anterior and posterior ethmoid cavities were taken down. Polypoid tissue was noted and removed.  The sinuses were copiously irrigated with saline solution.  The same procedure was repeated on the right side without exception. More polyps were noted on the right side. All polyps were removed.   The care of the patient was turned over to the anesthesiologist. The patient was awakened from anesthesia without difficulty. The patient was extubated and transferred to the recovery room in good condition.   OPERATIVE FINDINGS: Bilateral chronic maxillary and ethmoid sinusitis/polyposis.  Bilateral inferior turbinate hypertrophy.  SPECIMEN: Bilateral sinus contents.   FOLLOWUP CARE: The patient be discharged home once she is awake and alert. The patient will follow up in my office in 3 days for sinus debridement.  Emmilia Sowder Philomena Doheny, MD

## 2023-02-25 ENCOUNTER — Encounter (HOSPITAL_BASED_OUTPATIENT_CLINIC_OR_DEPARTMENT_OTHER): Payer: Self-pay | Admitting: Otolaryngology

## 2023-10-08 ENCOUNTER — Telehealth (INDEPENDENT_AMBULATORY_CARE_PROVIDER_SITE_OTHER): Payer: Self-pay | Admitting: Otolaryngology

## 2023-10-08 NOTE — Telephone Encounter (Signed)
 Reminder Call:  Date: 10/09/2023 Status: Sch  Time: 2:10 PM Left Voicemail message w/time and location-[id number] [place], [place] [address]

## 2023-10-09 ENCOUNTER — Encounter (INDEPENDENT_AMBULATORY_CARE_PROVIDER_SITE_OTHER): Payer: Self-pay

## 2023-10-09 ENCOUNTER — Ambulatory Visit (INDEPENDENT_AMBULATORY_CARE_PROVIDER_SITE_OTHER): Payer: PRIVATE HEALTH INSURANCE | Admitting: Otolaryngology

## 2023-10-09 VITALS — BP 116/74 | HR 80 | Ht 62.0 in | Wt 190.0 lb

## 2023-10-09 DIAGNOSIS — J322 Chronic ethmoidal sinusitis: Secondary | ICD-10-CM

## 2023-10-09 DIAGNOSIS — J310 Chronic rhinitis: Secondary | ICD-10-CM

## 2023-10-09 DIAGNOSIS — J338 Other polyp of sinus: Secondary | ICD-10-CM

## 2023-10-09 DIAGNOSIS — R0981 Nasal congestion: Secondary | ICD-10-CM

## 2023-10-09 DIAGNOSIS — J320 Chronic maxillary sinusitis: Secondary | ICD-10-CM

## 2023-10-09 NOTE — Progress Notes (Signed)
 Patient ID: Janice [name], female   DOB: 1950-07-31, 73 y.o.   MRN: [id number]  Follow-up: Chronic maxillary and ethmoid sinusitis, sinonasal polyps  HPI: The patient is a 73 year old female who returns today for her follow-up evaluation.  The patient has a history of nasal septal perforation, chronic nasal obstruction, and bilateral chronic rhinosinusitis, involving the maxillary and ethmoid sinuses bilaterally.  She underwent bilateral endoscopic sinus surgery and bilateral turbinate reduction in August 2024.  The patient returns today reporting improvement in her nasal breathing.  She has no recent sinusitis.  She reports occasional nasal congestion, usually during the allergy months.  Currently she denies any facial pain, fever, or visual change.  Exam: General: Communicates without difficulty, well nourished, no acute distress. Head: Normocephalic, no evidence injury, no tenderness, facial buttresses intact without stepoff. Face/sinus: No tenderness to palpation and percussion. Facial movement is normal and symmetric. Eyes: PERRL, EOMI. No scleral icterus, conjunctivae clear. Neuro: CN II exam reveals vision grossly intact.  No nystagmus at any point of gaze. Ears: Auricles well formed without lesions.  Ear canals are intact without mass or lesion.  No erythema or edema is appreciated.  The TMs are intact without fluid. Nose: External evaluation reveals normal support and skin without lesions.  Dorsum is intact.  Anterior rhinoscopy reveals congested mucosa over anterior aspect of inferior turbinates and septum.  Septal perforation noted.  No purulence noted. Oral:  Oral cavity and oropharynx are intact, symmetric, without erythema or edema.  Mucosa is moist without lesions. Neck: Full range of motion without pain.  There is no significant lymphadenopathy.  No masses palpable.  Thyroid bed within normal limits to palpation.  Parotid glands and submandibular glands equal bilaterally without mass.  Trachea  is midline. Neuro:  CN 2-12 grossly intact.   Assessment: 1.  Chronic rhinitis with nasal mucosal congestion. 2.  The patient has no recent recurrent sinusitis.  No recurrent polyposis is noted today.  Plan: 1.  The physical exam findings are reviewed with the patient. 2.  Continue Flonase nasal spray 2 sprays each nostril daily. 3.  The patient will return for reevaluation in 6 months.

## 2023-10-11 DIAGNOSIS — J322 Chronic ethmoidal sinusitis: Secondary | ICD-10-CM | POA: Insufficient documentation

## 2023-10-11 DIAGNOSIS — J338 Other polyp of sinus: Secondary | ICD-10-CM | POA: Insufficient documentation

## 2023-10-11 DIAGNOSIS — J320 Chronic maxillary sinusitis: Secondary | ICD-10-CM | POA: Insufficient documentation

## 2024-04-12 ENCOUNTER — Encounter (INDEPENDENT_AMBULATORY_CARE_PROVIDER_SITE_OTHER): Payer: Self-pay | Admitting: Otolaryngology

## 2024-04-12 ENCOUNTER — Ambulatory Visit (INDEPENDENT_AMBULATORY_CARE_PROVIDER_SITE_OTHER): Payer: PRIVATE HEALTH INSURANCE | Admitting: Otolaryngology

## 2024-04-12 VITALS — BP 115/76 | HR 72

## 2024-04-12 DIAGNOSIS — J310 Chronic rhinitis: Secondary | ICD-10-CM

## 2024-04-12 DIAGNOSIS — J3489 Other specified disorders of nose and nasal sinuses: Secondary | ICD-10-CM | POA: Diagnosis not present

## 2024-04-12 DIAGNOSIS — R0981 Nasal congestion: Secondary | ICD-10-CM | POA: Diagnosis not present

## 2024-04-13 DIAGNOSIS — J3489 Other specified disorders of nose and nasal sinuses: Secondary | ICD-10-CM | POA: Insufficient documentation

## 2024-04-13 DIAGNOSIS — J310 Chronic rhinitis: Secondary | ICD-10-CM | POA: Insufficient documentation

## 2024-04-13 NOTE — Progress Notes (Signed)
 Patient ID: Janice [name], female   DOB: Dec 05, 1950, 73 y.o.   MRN: [id number]  Follow-up: Chronic maxillary and ethmoid sinusitis, sinonasal polyps  HPI: The patient is a 73 year old female who returns today for her follow-up evaluation.  The patient has a history of nasal septal perforation, chronic nasal obstruction, and bilateral chronic rhinosinusitis, involving the maxillary and ethmoid sinuses bilaterally.  She underwent bilateral endoscopic sinus surgery and bilateral turbinate reduction in August 2024.  The patient returns today reporting improvement in her nasal breathing.  She has no recent sinusitis.  She reports only mild nasal congestion during the allergy months.  Currently she denies any facial pain, fever, or visual change.  Exam: General: Communicates without difficulty, well nourished, no acute distress. Head: Normocephalic, no evidence injury, no tenderness, facial buttresses intact without stepoff. Face/sinus: No tenderness to palpation and percussion. Facial movement is normal and symmetric. Eyes: PERRL, EOMI. No scleral icterus, conjunctivae clear. Neuro: CN II exam reveals vision grossly intact.  No nystagmus at any point of gaze. Ears: Auricles well formed without lesions.  Ear canals are intact without mass or lesion.  No erythema or edema is appreciated.  The TMs are intact without fluid. Nose: External evaluation reveals normal support and skin without lesions.  Dorsum is intact.  Anterior rhinoscopy reveals congested mucosa over anterior aspect of inferior turbinates and septum.  Septal perforation noted.  No purulence noted. Oral:  Oral cavity and oropharynx are intact, symmetric, without erythema or edema.  Mucosa is moist without lesions. Neck: Full range of motion without pain.  There is no significant lymphadenopathy.  No masses palpable.  Thyroid bed within normal limits to palpation.  Parotid glands and submandibular glands equal bilaterally without mass.  Trachea is  midline. Neuro:  CN 2-12 grossly intact.   Assessment: 1.  Chronic rhinitis with mild nasal mucosal congestion. 2.  The patient has no recent recurrent sinusitis.  No recurrent polyposis is noted today. 3.  Stable nasal septal perforation.  Plan: 1.  The physical exam findings are reviewed with the patient. 2.  Continue Flonase nasal spray 2 sprays each nostril daily. 3.  The patient will return for reevaluation in 6 months.

## 2024-10-13 ENCOUNTER — Ambulatory Visit (INDEPENDENT_AMBULATORY_CARE_PROVIDER_SITE_OTHER): Payer: PRIVATE HEALTH INSURANCE | Admitting: Otolaryngology
